# Patient Record
Sex: Male | Born: 1950 | Race: White | Hispanic: No | Marital: Married | Smoking: Former smoker
Health system: Southern US, Community
[De-identification: ages and names within clinical notes are randomized; demographics above are authoritative.]

## PROBLEM LIST (undated history)

## (undated) DIAGNOSIS — E119 Type 2 diabetes mellitus without complications: Secondary | ICD-10-CM

## (undated) DIAGNOSIS — I639 Cerebral infarction, unspecified: Secondary | ICD-10-CM

## (undated) DIAGNOSIS — I10 Essential (primary) hypertension: Secondary | ICD-10-CM

## (undated) DIAGNOSIS — I4819 Other persistent atrial fibrillation: Secondary | ICD-10-CM

## (undated) HISTORY — PX: KNEE ARTHROSCOPY: SUR90

## (undated) HISTORY — DX: Cerebral infarction, unspecified: I63.9

---

## 2000-06-23 ENCOUNTER — Encounter: Admission: RE | Admit: 2000-06-23 | Discharge: 2000-09-21 | Payer: Self-pay | Admitting: Internal Medicine

## 2000-10-29 ENCOUNTER — Encounter: Admission: RE | Admit: 2000-10-29 | Discharge: 2001-01-27 | Payer: Self-pay | Admitting: Internal Medicine

## 2001-05-05 ENCOUNTER — Ambulatory Visit (HOSPITAL_COMMUNITY): Admission: RE | Admit: 2001-05-05 | Discharge: 2001-05-05 | Payer: Self-pay | Admitting: Gastroenterology

## 2005-04-18 ENCOUNTER — Encounter: Admission: RE | Admit: 2005-04-18 | Discharge: 2005-04-18 | Payer: Self-pay | Admitting: Internal Medicine

## 2006-03-31 ENCOUNTER — Encounter: Admission: RE | Admit: 2006-03-31 | Discharge: 2006-03-31 | Payer: Self-pay | Admitting: Specialist

## 2008-10-12 ENCOUNTER — Ambulatory Visit (HOSPITAL_BASED_OUTPATIENT_CLINIC_OR_DEPARTMENT_OTHER): Admission: RE | Admit: 2008-10-12 | Discharge: 2008-10-12 | Payer: Self-pay | Admitting: Orthopedic Surgery

## 2009-10-26 ENCOUNTER — Ambulatory Visit (HOSPITAL_COMMUNITY): Admission: RE | Admit: 2009-10-26 | Discharge: 2009-10-26 | Payer: Self-pay | Admitting: Gastroenterology

## 2011-05-07 NOTE — Op Note (Signed)
NAME:  Gerald Baker, Gerald Baker NO.:  [id number]   MEDICAL RECORD NO.:  [id number]          PATIENT TYPE:  AMB   LOCATION:  [facility]                         FACILITY:  [facility]   PHYSICIAN:  Ollen Gross, M.D.    DATE OF BIRTH:  1951/03/28   DATE OF PROCEDURE:  10/12/2008  DATE OF DISCHARGE:                               OPERATIVE REPORT   PREOPERATIVE DIAGNOSIS:  Right knee medial meniscal tear.   POSTOPERATIVE DIAGNOSES:  Right knee medial meniscal tear, plus lateral  meniscal tear, plus chondral defect medial.   PROCEDURE:  Right knee arthroscopy with medial and lateral meniscal  debridement and chondroplasty.   SURGEON:  Ollen Gross, M.D.   No assistant.   ANESTHESIA:  General.   ESTIMATED BLOOD LOSS:  Minimal.   DRAINS:  None.   COMPLICATIONS:  None.   CONDITION:  Stable to recovery.   BRIEF CLINICAL NOTE:  Gerald Baker is a 60 year old male with a long history  of right knee pain with worsening recent pain and mechanical symptoms.  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  Repeat MRI showed a recurrent medial meniscal tear.  He  presents now for arthroscopy and debridement.   PROCEDURE IN DETAIL:  After successful administration of general  anesthetic a tourniquet is placed high on the right thigh and his right  lower extremity is prepped and draped in the usual sterile fashion.  Standard superomedial and inferolateral incisions were made.  Inflow  cannula passed superomedial and camera passed inferolateral.  Arthroscopic visualization proceeds.  Undersurface of the patella and  trochlea shows some grade II change, but no full-thickness defects.  The  medial and lateral gutters were visualized.  There were no loose bodies  present.  Flexion and valgus force applied to the knee and medial  compartment was entered.  The medial side does show a 2 x 2 cm chondral  defect on the medial femoral condyle.  In addition, there is a  tear in  the body and posterior horn of the medial meniscus.  The spinal needle  is used to localize the inferomedial portal.  A small incision made and  dilator placed.  Meniscus debrided back to stable base with baskets and  4.2 mm shaver and sealed off with the ArthroCare device.  The chondral  defect on the medial femoral condyle was debrided back to stable base  which was bony in the central aspect of the defect and then  cartilaginous base on the edges.  I abraded the bone for hopeful  formation of fibrocartilage.  There were no other lesions noted in the  medial compartment.  Intercondylar notch visualized.  The ACL has some  bulbous degeneration, but is intact.  Lateral compartment entered.  There is tear in the lateral meniscus.  It was debrided back to a stable  base with the basket shaver and sealed off with the ArthroCare.  The  patellofemoral joint is again inspected.  There were no unstable  fragments noted.  The arthroscopic equipment was then removed from the  inferior portals  which were closed with interrupted 4-0 nylon.  The 20  mL of 0.25% Marcaine with epi injected through the inflow cannula then  that is removed and that portal closed with nylon.  Bulky sterile  dressing is then applied and he is awakened and transported to recovery  in stable condition.      Ollen Gross, M.D.  Electronically Signed     FA/MEDQ  D:  10/12/2008  T:  10/12/2008  Job:  [id number]

## 2011-09-24 LAB — URINALYSIS, ROUTINE W REFLEX MICROSCOPIC
Bilirubin Urine: NEGATIVE
Glucose, UA: NEGATIVE
Ketones, ur: NEGATIVE
Nitrite: NEGATIVE
Urobilinogen, UA: 0.2
pH: 6.5

## 2011-09-24 LAB — CBC
HCT: 46.3
RDW: 13.8
WBC: 10.1

## 2011-09-24 LAB — PROTIME-INR: Prothrombin Time: 12.9

## 2011-09-24 LAB — BASIC METABOLIC PANEL
Calcium: 9.4
GFR calc Af Amer: >60
GFR calc non Af Amer: >60
Glucose, Bld: 120 — ABNORMAL HIGH
Potassium: 4.2

## 2011-09-24 LAB — APTT: aPTT: 32

## 2014-01-20 ENCOUNTER — Encounter: Payer: Self-pay | Admitting: *Deleted

## 2014-01-20 ENCOUNTER — Telehealth: Payer: Self-pay | Admitting: Interventional Cardiology

## 2014-01-20 DIAGNOSIS — I4819 Other persistent atrial fibrillation: Secondary | ICD-10-CM

## 2014-01-20 DIAGNOSIS — M1711 Unilateral primary osteoarthritis, right knee: Secondary | ICD-10-CM

## 2014-01-20 DIAGNOSIS — E669 Obesity, unspecified: Secondary | ICD-10-CM

## 2014-01-20 DIAGNOSIS — E785 Hyperlipidemia, unspecified: Secondary | ICD-10-CM

## 2014-01-20 DIAGNOSIS — E119 Type 2 diabetes mellitus without complications: Secondary | ICD-10-CM | POA: Insufficient documentation

## 2014-01-20 DIAGNOSIS — K5790 Diverticulosis of intestine, part unspecified, without perforation or abscess without bleeding: Secondary | ICD-10-CM

## 2014-01-20 DIAGNOSIS — I8390 Asymptomatic varicose veins of unspecified lower extremity: Secondary | ICD-10-CM | POA: Insufficient documentation

## 2014-01-20 DIAGNOSIS — R195 Other fecal abnormalities: Secondary | ICD-10-CM

## 2014-01-20 DIAGNOSIS — I480 Paroxysmal atrial fibrillation: Secondary | ICD-10-CM | POA: Insufficient documentation

## 2014-01-20 DIAGNOSIS — I10 Essential (primary) hypertension: Secondary | ICD-10-CM | POA: Insufficient documentation

## 2014-01-20 NOTE — Telephone Encounter (Signed)
returned pt call.pt sts that yesterday he over did it with yard work. pt sts that  his bp was 98/70 his hear rate has been between 70-80.pt denies sob, chest pain, palpitations. pt sts that he feels better today bp 118/60.pt would like an earlier appt made with Tereso NewcomerScott Weaver, PA. Pt aware of appt and verbalized understanding.

## 2014-01-20 NOTE — Telephone Encounter (Signed)
New Message  Pt called states that his BP is extremely low and he believes that he is currently in AFIB// Requesting a soon appt with Dr. Smith// Declined PA or NP// Please assist.

## 2014-01-24 ENCOUNTER — Encounter: Payer: Self-pay | Admitting: Physician Assistant

## 2014-01-24 ENCOUNTER — Ambulatory Visit (INDEPENDENT_AMBULATORY_CARE_PROVIDER_SITE_OTHER): Payer: BC Managed Care – PPO | Admitting: Physician Assistant

## 2014-01-24 VITALS — BP 148/88 | HR 102 | Ht 72.0 in | Wt 289.0 lb

## 2014-01-24 DIAGNOSIS — I4891 Unspecified atrial fibrillation: Secondary | ICD-10-CM

## 2014-01-24 DIAGNOSIS — I10 Essential (primary) hypertension: Secondary | ICD-10-CM

## 2014-01-24 MED ORDER — RIVAROXABAN 20 MG PO TABS: 20.0000 mg | ORAL_TABLET | Freq: Every day | ORAL | Status: DC

## 2014-01-24 MED ORDER — METOPROLOL TARTRATE 100 MG PO TABS: 150.0000 mg | ORAL_TABLET | Freq: Two times a day (BID) | ORAL | Status: DC

## 2014-01-24 NOTE — Patient Instructions (Addendum)
INCREASE METOPROLOL TO 150 MG TWICE DAILY; REFILL SENT IN  START XARELTO 20 MG DAILY  PLEASE FOLLOW UP WITH SCOTT WEAVER, PAC 3 WEEKS SAME DAY DR. Katrinka BlazingSMITH IS IN THE OFFICE  STOP ASPIRIN  FISH OIL OK TO TAKE

## 2014-01-24 NOTE — Progress Notes (Addendum)
[address] [place], [place]  [address] Phone: [phone]([phone] Fax:  [phone]  Date:  01/24/2014   ID:  Aris LotHarold P Stoutenburg, DOB 09-16-1951, MRN [id number]  PCP:  Lillia MountainGRIFFIN,JOHN JOSEPH, MD  Cardiologist:  Dr. Verdis PrimeHenry Smith     History of Present Illness: Gerald BaconHarold P Kiger is a 63 y.o. male with a hx of AFib, T2DM, HTN, HL.  Echo (10/2011):  EF 45-50%, mild LVH, trace MR, mild TR, mild LAE.  Event monitor (11/2011):  NSR, no AFib.  Last seen by Dr. Verdis PrimeHenry Smith 12/10/2011.  Xarelto stopped at that time.    He was out in his yard doing heavy work last week.  He was exhausted and felt fatigued the next day.  He felt like he did when he was out of rhythm in 2012.  His BP was in the 90s.  He felt lightheaded.  Over the next couple of days, he felt better and feels back to normal today.  He thought he was probably back in NSR today.  He denies chest pain, dyspnea, syncope.  No orthopnea, PND, edema.    Recent Labs: No results found for requested labs within last 365 days. 01/07/2014:  K 5.1, creatinine 0.76, ALT 22 (from PCP office)  Wt Readings from Last 3 Encounters:  01/24/14 289 lb (131.09 kg)     No past medical history on file.  Current Outpatient Prescriptions  Medication Sig Dispense Refill  . aspirin 81 MG tablet Take 81 mg by mouth daily.      . metFORMIN (GLUCOPHAGE) 500 MG tablet Take 500 mg by mouth 2 (two) times daily.      . metoprolol (LOPRESSOR) 100 MG tablet Take 100 mg by mouth 2 (two) times daily.      . naproxen (NAPROSYN) 500 MG tablet Take 500 mg by mouth as needed.       . Omega-3 Fatty Acids (FISH OIL) 1200 MG CAPS Take by mouth.       No current facility-administered medications for this visit.    Allergies:   Altace; Cozaar; Penicillins; and Sulfur   Social History:  The patient  reports that he has quit smoking. He does not have any smokeless tobacco history on file.   Family History:  The patient's family history is not on file.   ROS:  Please see the  history of present illness.      All other systems reviewed and negative.   PHYSICAL EXAM: VS:  BP 148/88  Pulse 102  Ht 6' (1.829 m)  Wt 289 lb (131.09 kg)  BMI 39.19 kg/m2 Well nourished, well developed, in no acute distress HEENT: normal Neck: no JVD Cardiac:  normal S1, S2; irreg irreg rhythm; no murmur Lungs:  clear to auscultation bilaterally, no wheezing, rhonchi or rales Abd: soft, nontender, no hepatomegaly Ext: no edema Skin: warm and dry Neuro:  CNs 2-12 intact, no focal abnormalities noted  EKG:  Atrial fibrillation, HR 102     ASSESSMENT AND PLAN:  1. Artrial Fibrillation with RVR:  He is back in AFib with RVR.  CHADS2-VASc=2.  He would benefit from long term anticoagulation.  Stop ASA.  Start Xarelto 20 QD.  Increase Metoprolol to 150 mg BID.  Follow up in 3 weeks. If still in AFib at that time, consider DCCV.  Question if he should be started on an antiarrhythmic to ensure he maintains NSR.  Will review with Dr. Verdis PrimeHenry Smith at time of follow up. 2. Hypertension:  BP above target.  Adjust beta blocker as noted.  3. Disposition:  F/u with Dr. Verdis Prime or me in 3 weeks.    Signed, Tereso Newcomer, PA-C  01/24/2014 11:15 AM

## 2014-02-10 ENCOUNTER — Ambulatory Visit (INDEPENDENT_AMBULATORY_CARE_PROVIDER_SITE_OTHER): Payer: BC Managed Care – PPO | Admitting: Physician Assistant

## 2014-02-10 ENCOUNTER — Encounter: Payer: Self-pay | Admitting: Physician Assistant

## 2014-02-10 VITALS — BP 140/82 | HR 58 | Ht 72.0 in | Wt 285.0 lb

## 2014-02-10 DIAGNOSIS — I10 Essential (primary) hypertension: Secondary | ICD-10-CM

## 2014-02-10 DIAGNOSIS — I4819 Other persistent atrial fibrillation: Secondary | ICD-10-CM

## 2014-02-10 DIAGNOSIS — I4891 Unspecified atrial fibrillation: Secondary | ICD-10-CM

## 2014-02-10 NOTE — Progress Notes (Signed)
[address] [place], [place]  [address] Phone: [phone] Fax:  [phone]  Date:  02/10/2014   ID:  Gerald, Baker 10-05-1951, MRN [id number]  PCP:  Lillia Mountain, MD  Cardiologist:  Dr. Verdis Prime     History of Present Illness: Gerald Baker is a 63 y.o. male with a hx of AFib, T2DM, HTN, HL.  Echo (10/2011):  EF 45-50%, mild LVH, trace MR, mild TR, mild LAE.  Event monitor (11/2011):  NSR, no AFib.  Last seen by Dr. Verdis Prime 12/10/2011.  Xarelto stopped at that time.    I saw him in 01/24/14. He was back in atrial fibrillation with RVR. I placed him back on Xarelto for anticoagulation and increased his metoprolol. He was brought back for follow up today with an eye towards cardioversion if he remains in atrial fibrillation.  He is back in NSR today.  He feels like he was probably out of rhythm for about 10 days. He feels back to normal.  Energy level is normal.  No chest pain, dyspnea, syncope, orthopnea, PND, edema.    Recent Labs: No results found for requested labs within last 365 days. 01/07/2014:  K 5.1, creatinine 0.76, ALT 22 (from PCP office)  Wt Readings from Last 3 Encounters:  02/10/14 285 lb (129.275 kg)  01/24/14 289 lb (131.09 kg)     No past medical history on file.  Current Outpatient Prescriptions  Medication Sig Dispense Refill  . metFORMIN (GLUCOPHAGE) 500 MG tablet Take 500 mg by mouth 2 (two) times daily.      . metoprolol (LOPRESSOR) 100 MG tablet Take 1.5 tablets (150 mg total) by mouth 2 (two) times daily.  90 tablet  11  . naproxen (NAPROSYN) 500 MG tablet Take 500 mg by mouth as needed.       . Omega-3 Fatty Acids (FISH OIL) 1200 MG CAPS Take by mouth.      . Rivaroxaban (XARELTO) 20 MG TABS tablet Take 1 tablet (20 mg total) by mouth daily with supper.  30 tablet  11   No current facility-administered medications for this visit.    Allergies:   Altace; Cozaar; Penicillins; and Sulfur   Social History:  The patient   reports that he has quit smoking. He does not have any smokeless tobacco history on file.   Family History:  The patient's family history is not on file.   ROS:  Please see the history of present illness.  No hx of snoring.  All other systems reviewed and negative.   PHYSICAL EXAM: VS:  BP 140/82  Pulse 58  Ht 6' (1.829 m)  Wt 285 lb (129.275 kg)  BMI 38.64 kg/m2 Well nourished, well developed, in no acute distress HEENT: normal Cardiac:  normal S1, S2; RRR; no murmur Lungs:  clear to auscultation bilaterally, no wheezing, rhonchi or rales Abd: soft, non-tender  Ext: no edema Skin: warm and dry Neuro:  CNs 2-12 intact, no focal abnormalities noted  EKG:   Sinus brady, HR 58, normal axis, no ST changes     ASSESSMENT AND PLAN:  1. Paroxysmal Atrial Fibrillation:  He is back in NSR.  CHADS2-VASc=2 (HTN, DM).  He will remain on Xarelto.  We had a long discussion today regarding his increased risk of stroke with AFib and the risk reduction with anticoagulation Rx.  He is hesitant but agreeable to remain on Xarelto.  Check CBC and BMET in 1 month.   2. Hypertension:  Better controlled.  Continue current Rx. 3. Disposition:  F/u with f/u with Dr. Verdis PrimeHenry Smith in 3 mos.    Signed, Tereso NewcomerScott Kieffer Blatz, PA-C  02/10/2014 9:34 AM

## 2014-02-10 NOTE — Patient Instructions (Addendum)
LAB WORK 03/18/14 @ 10:30  PLEASE FOLLOW UP WITH DR. Katrinka BlazingSMITH 05/10/14 @ 10:30  Your physician recommends that you continue on your current medications as directed. Please refer to the Current Medication list given to you today.

## 2014-02-14 ENCOUNTER — Ambulatory Visit: Payer: BC Managed Care – PPO | Admitting: Physician Assistant

## 2014-03-18 ENCOUNTER — Other Ambulatory Visit (INDEPENDENT_AMBULATORY_CARE_PROVIDER_SITE_OTHER): Payer: BC Managed Care – PPO

## 2014-03-18 DIAGNOSIS — I4891 Unspecified atrial fibrillation: Secondary | ICD-10-CM

## 2014-03-18 DIAGNOSIS — I10 Essential (primary) hypertension: Secondary | ICD-10-CM

## 2014-03-18 DIAGNOSIS — I4819 Other persistent atrial fibrillation: Secondary | ICD-10-CM

## 2014-03-18 LAB — CBC WITH DIFFERENTIAL/PLATELET
BASOS ABS: 0 10*3/uL (ref 0.0–0.1)
Basophils Relative: 0.5 % (ref 0.0–3.0)
EOS PCT: 3.2 % (ref 0.0–5.0)
Eosinophils Absolute: 0.2 10*3/uL (ref 0.0–0.7)
HEMATOCRIT: 43.1 % (ref 39.0–52.0)
HEMOGLOBIN: 14.4 g/dL (ref 13.0–17.0)
LYMPHS PCT: 34.4 % (ref 12.0–46.0)
Lymphs Abs: 2.6 10*3/uL (ref 0.7–4.0)
MCHC: 33.4 g/dL (ref 30.0–36.0)
MCV: 89.1 fl (ref 78.0–100.0)
MONO ABS: 0.6 10*3/uL (ref 0.1–1.0)
MONOS PCT: 7.9 % (ref 3.0–12.0)
NEUTROS ABS: 4.1 10*3/uL (ref 1.4–7.7)
Neutrophils Relative %: 54 % (ref 43.0–77.0)
PLATELETS: 237 10*3/uL (ref 150.0–400.0)
RBC: 4.84 Mil/uL (ref 4.22–5.81)
RDW: 14.5 % (ref 11.5–14.6)
WBC: 7.6 10*3/uL (ref 4.5–10.5)

## 2014-03-18 LAB — BASIC METABOLIC PANEL
BUN: 15 mg/dL (ref 6–23)
CALCIUM: 9.1 mg/dL (ref 8.4–10.5)
CO2: 29 meq/L (ref 19–32)
CREATININE: 0.8 mg/dL (ref 0.4–1.5)
Chloride: 100 mEq/L (ref 96–112)
GFR: 112.71 mL/min (ref >60.00)
Glucose, Bld: 112 mg/dL — ABNORMAL HIGH (ref 70–99)
POTASSIUM: 4.3 meq/L (ref 3.5–5.1)
Sodium: 137 mEq/L (ref 135–145)

## 2014-05-10 ENCOUNTER — Encounter: Payer: Self-pay | Admitting: Interventional Cardiology

## 2014-05-10 ENCOUNTER — Ambulatory Visit (INDEPENDENT_AMBULATORY_CARE_PROVIDER_SITE_OTHER): Payer: BC Managed Care – PPO | Admitting: Interventional Cardiology

## 2014-05-10 VITALS — BP 140/82 | HR 60 | Ht 72.0 in | Wt 279.0 lb

## 2014-05-10 DIAGNOSIS — R0683 Snoring: Secondary | ICD-10-CM

## 2014-05-10 DIAGNOSIS — I480 Paroxysmal atrial fibrillation: Secondary | ICD-10-CM

## 2014-05-10 DIAGNOSIS — I5032 Chronic diastolic (congestive) heart failure: Secondary | ICD-10-CM | POA: Insufficient documentation

## 2014-05-10 DIAGNOSIS — I10 Essential (primary) hypertension: Secondary | ICD-10-CM

## 2014-05-10 DIAGNOSIS — Z7901 Long term (current) use of anticoagulants: Secondary | ICD-10-CM | POA: Insufficient documentation

## 2014-05-10 DIAGNOSIS — E785 Hyperlipidemia, unspecified: Secondary | ICD-10-CM

## 2014-05-10 DIAGNOSIS — R0989 Other specified symptoms and signs involving the circulatory and respiratory systems: Secondary | ICD-10-CM

## 2014-05-10 DIAGNOSIS — I4891 Unspecified atrial fibrillation: Secondary | ICD-10-CM

## 2014-05-10 DIAGNOSIS — R0609 Other forms of dyspnea: Secondary | ICD-10-CM

## 2014-05-10 NOTE — Progress Notes (Signed)
Patient ID: Gerald Baker Hennick, male   DOB: 1951/06/07, 63 y.o.   MRN: [id number]    [address] [place], [place]  [address] Phone: [phone]([phone] Fax:  [phone]) [phone]  Date:  05/10/2014   ID:  Gerald Baker Myles, DOB 1951/06/07, MRN [id number]  PCP:  Lillia MountainGRIFFIN,JOHN JOSEPH, MD   ASSESSMENT:  1. Paroxysmal atrial fibrillation, now back in normal sinus rhythm. Prior episode required electrical cardioversion. 2. Chronic anticoagulation therapy 3. Snoring 4. Hypertension 5. Obesity  PLAN:  1. Continue chronic anticoagulation with Xarelto 2. Physical activity as tolerated 3. No attempt at rhythm control at this time since he is only had 2 episodes of atrial fibrillation over the past 4 years.   SUBJECTIVE: Gerald Baker Hudgins is a 63 y.o. male now feels back to normal. Fatigue associated with atrial fibrillation has resolved. He was unable tolerate the 150 mg twice a day of metoprolol. He does snore. They doubt he has sleep apnea. He has not had bleeding on anticoagulation. No neurological complaints. He denies orthopnea and dyspnea on exertion.   Wt Readings from Last 3 Encounters:  05/10/14 279 lb (126.554 kg)  02/10/14 285 lb (129.275 kg)  01/24/14 289 lb (131.09 kg)     No past medical history on file.  Current Outpatient Prescriptions  Medication Sig Dispense Refill  . metFORMIN (GLUCOPHAGE) 500 MG tablet Take 500 mg by mouth 2 (two) times daily.      . metoprolol (LOPRESSOR) 100 MG tablet Take 1.5 tablets (150 mg total) by mouth 2 (two) times daily.  90 tablet  11  . naproxen (NAPROSYN) 500 MG tablet Take 500 mg by mouth as needed.       . Rivaroxaban (XARELTO) 20 MG TABS tablet Take 1 tablet (20 mg total) by mouth daily with supper.  30 tablet  11   No current facility-administered medications for this visit.    Allergies:    Allergies  Allergen Reactions  . Altace [Ramipril]     cough  . Cozaar [Losartan Potassium]     Leg pain  . Penicillins   . Sulfur     rash      Social History:  The patient  reports that he has quit smoking. He does not have any smokeless tobacco history on file.   ROS:  Please see the history of present illness.   No syncope. Denies angina.   All other systems reviewed and negative.   OBJECTIVE: VS:  BP 140/82  Pulse 60  Ht 6' (1.829 m)  Wt 279 lb (126.554 kg)  BMI 37.83 kg/m2 Well nourished, well developed, in no acute distress, obese, abnormally tanned HEENT: normal Neck: JVD flat. Carotid bruit absent  Cardiac:  normal S1, S2; RRR; no murmur Lungs:  clear to auscultation bilaterally, no wheezing, rhonchi or rales Abd: soft, nontender, no hepatomegaly Ext: Edema absent but with bilateral varicosities. Pulses 2+ and symmetric Skin: warm and dry Neuro:  CNs 2-12 intact, no focal abnormalities noted  EKG:  Not repeated. Was in normal sinus rhythm on last visit 2 months ago       Signed, Darci NeedleHenry W. B. Haston Casebolt III, MD 05/10/2014 11:35 AM

## 2014-05-10 NOTE — Patient Instructions (Signed)
Your physician recommends that you continue on your current medications as directed. Please refer to the Current Medication list given to you today.  Your physician has recommended that you have a sleep study. This test records several body functions during sleep, including: brain activity, eye movement, oxygen and carbon dioxide blood levels, heart rate and rhythm, breathing rate and rhythm, the flow of air through your mouth and nose, snoring, body muscle movements, and chest and belly movement.  Your physician wants you to follow-up in: 6 months You will receive a reminder letter in the mail two months in advance. If you don't receive a letter, please call our office to schedule the follow-up appointment.

## 2014-09-15 ENCOUNTER — Telehealth: Payer: Self-pay | Admitting: Interventional Cardiology

## 2014-09-15 NOTE — Telephone Encounter (Signed)
Dr. Katrinka Blazing aware of pt falling and recommends for pt to hold the Xarelto dose tonight. Pt is aware he verbalized understanding.

## 2014-09-15 NOTE — Telephone Encounter (Signed)
Pt called because this morning he fell and got a small cut on his elbow, had a small amount of bleeding. He applied a bandage; the bleeding did stop. At the same time he had a knot that  came up on his wrist.  The knot is about an inch and half wide  up swollen it has not gotten bigger since it happened . Pt is taken xarelto. Pt would like to know if it is okay for him to take  to take the Xarelto dose this evening.

## 2014-09-15 NOTE — Telephone Encounter (Signed)
New message    Patient fell cut on elbow.- bleeding has stop.   Knot came up wrist . On xarelto.   No dizziness, no sob, no chest pain.

## 2014-11-24 NOTE — Progress Notes (Signed)
Patient ID: Gerald Baker Tessler, male   DOB: September 10, 1951, 63 y.o.   MRN: [id number]    [address] [place], [place]  [address] Phone: [phone]([phone] Fax:  [phone]  Date:  11/25/2014   ID:  Gerald Baker Roesler, DOB September 10, 1951, MRN [id number]  PCP:  Lillia MountainGRIFFIN,JOHN JOSEPH, MD   ASSESSMENT:  1. Chronic diastolic heart failure, stable as long as he is not in atrial fibrillation 2. Paroxysmal atrial fibrillation, clinically silent 3. Chronic anticoagulation therapy with Xarelto 4. Obesity and snoring raising concern for sleep apnea   PLAN:  1. No cyanosis of any falls or head trauma 2. Will begin giving Xarelto on a 3 month supply schedule 3. Clinical follow-up in one year   SUBJECTIVE: Gerald Baker Pfeffer is a 63 y.o. male doing well. He did have a fall earlier this year weight to normal dominant embankment while plugging his grass with the machine. In the upper for large bruise on his left thigh and a large hematoma in the wrist of the left. He did not hit his head. He has not had chest pain, orthopnea, or PND. He has had an episode or 2 of atrial fibrillation each lasts up to 5 days. Generally though he has done quite well without clinical problems.   Wt Readings from Last 3 Encounters:  11/25/14 285 lb 6.4 oz (129.457 kg)  05/10/14 279 lb (126.554 kg)  02/10/14 285 lb (129.275 kg)     No past medical history on file.  Current Outpatient Prescriptions  Medication Sig Dispense Refill  . metFORMIN (GLUCOPHAGE) 500 MG tablet Take 500 mg by mouth 2 (two) times daily.    . metoprolol (LOPRESSOR) 100 MG tablet Take 1.5 tablets (150 mg total) by mouth 2 (two) times daily. (Patient taking differently: Take 100 mg by mouth 2 (two) times daily. ) 90 tablet 11  . naproxen (NAPROSYN) 500 MG tablet Take 500 mg by mouth as needed.     . Rivaroxaban (XARELTO) 20 MG TABS tablet Take 1 tablet (20 mg total) by mouth daily with supper. 30 tablet 11   No current facility-administered medications  for this visit.    Allergies:    Allergies  Allergen Reactions  . Altace [Ramipril]     cough  . Cozaar [Losartan Potassium]     Leg pain  . Penicillins   . Sulfur     rash    Social History:  The patient  reports that he has quit smoking. He does not have any smokeless tobacco history on file.   ROS:  Please see the history of present illness.   No blood in urine or stool. He did have a fall down an embankment while plugging his grass and got a large hematoma on his left wrist and had a big bruise on the left side.   All other systems reviewed and negative.   OBJECTIVE: VS:  BP 148/88 mmHg  Pulse 73  Ht 6' (1.829 m)  Wt 285 lb 6.4 oz (129.457 kg)  BMI 38.70 kg/m2  SpO2 99% Well nourished, well developed, in no acute distress, obese HEENT: normal Neck: JVD flat. Carotid bruit absent  Cardiac:  normal S1, S2; RRR; no murmur Lungs:  clear to auscultation bilaterally, no wheezing, rhonchi or rales Abd: soft, nontender, no hepatomegaly Ext: Edema absent. Pulses 2+ Skin: warm and dry Neuro:  CNs 2-12 intact, no focal abnormalities noted  EKG:  None performed today       Signed, Sherilyn CooterHenry  Lacie DraftW. B. Smith III, MD 11/25/2014 2:58 PM

## 2014-11-25 ENCOUNTER — Ambulatory Visit (INDEPENDENT_AMBULATORY_CARE_PROVIDER_SITE_OTHER): Payer: BC Managed Care – PPO | Admitting: Interventional Cardiology

## 2014-11-25 ENCOUNTER — Encounter: Payer: Self-pay | Admitting: Interventional Cardiology

## 2014-11-25 VITALS — BP 148/88 | HR 73 | Ht 72.0 in | Wt 285.4 lb

## 2014-11-25 DIAGNOSIS — I10 Essential (primary) hypertension: Secondary | ICD-10-CM

## 2014-11-25 DIAGNOSIS — I480 Paroxysmal atrial fibrillation: Secondary | ICD-10-CM

## 2014-11-25 DIAGNOSIS — E785 Hyperlipidemia, unspecified: Secondary | ICD-10-CM

## 2014-11-25 DIAGNOSIS — I5032 Chronic diastolic (congestive) heart failure: Secondary | ICD-10-CM

## 2014-11-25 DIAGNOSIS — Z7901 Long term (current) use of anticoagulants: Secondary | ICD-10-CM

## 2014-11-25 MED ORDER — METOPROLOL TARTRATE 100 MG PO TABS: 100.0000 mg | ORAL_TABLET | Freq: Two times a day (BID) | ORAL | Status: AC

## 2014-11-25 NOTE — Patient Instructions (Signed)
Your physician recommends that you continue on your current medications as directed. Please refer to the Current Medication list given to you today.  Your physician wants you to follow-up in: 1 year with Dr.Smith You will receive a reminder letter in the mail two months in advance. If you don't receive a letter, please call our office to schedule the follow-up appointment.  

## 2014-12-23 ENCOUNTER — Encounter: Payer: Self-pay | Admitting: Interventional Cardiology

## 2014-12-27 ENCOUNTER — Other Ambulatory Visit: Payer: Self-pay | Admitting: *Deleted

## 2014-12-27 ENCOUNTER — Other Ambulatory Visit: Payer: Self-pay

## 2014-12-27 MED ORDER — RIVAROXABAN 20 MG PO TABS: 20.0000 mg | ORAL_TABLET | Freq: Every day | ORAL | Status: DC

## 2015-01-04 ENCOUNTER — Other Ambulatory Visit: Payer: Self-pay

## 2015-01-04 MED ORDER — RIVAROXABAN 20 MG PO TABS: 20.0000 mg | ORAL_TABLET | Freq: Every day | ORAL | Status: DC

## 2015-01-04 NOTE — Telephone Encounter (Signed)
Refill rqst received from my chart for Xarelto to be sent to [facility]. Done

## 2015-06-07 ENCOUNTER — Other Ambulatory Visit: Payer: Self-pay

## 2015-06-07 MED ORDER — RIVAROXABAN 20 MG PO TABS: 20.0000 mg | ORAL_TABLET | Freq: Every day | ORAL | Status: DC

## 2015-10-16 ENCOUNTER — Telehealth: Payer: Self-pay | Admitting: Interventional Cardiology

## 2015-10-16 NOTE — Telephone Encounter (Signed)
New message  Patient c/o Afib:  High priority if patient c/o lightheadedness and shortness of breath.  1. How long have you been having Afib? For about 10 days. Pt states that he is on Xarelto but it hasn't ever lasted this long   2. Are you currently experiencing lightheadedness and shortness of breath? SOB yes. When he walks he finds that he gets SOB and that's not normal for him   3. Have you checked your BP and heart rate? (document readings) Yes. Pt reports that he keeps check on it and of course the BP monitor shows irregular heart beat after he takes Metoprolol.   9:57a 109/85 Heart rate was 72 This was about 30 minutes after the metoprolol 9:18a 120/101 heart rate was 68 This was before the medication   4. Are you experiencing any other symptoms? Not really other than SOB and kind of tired.

## 2015-10-18 ENCOUNTER — Ambulatory Visit (HOSPITAL_COMMUNITY)
Admission: RE | Admit: 2015-10-18 | Discharge: 2015-10-18 | Disposition: A | Discharge status: Home / Self Care | Payer: BC Managed Care – PPO | Source: Ambulatory Visit | Attending: Nurse Practitioner | Admitting: Nurse Practitioner

## 2015-10-18 ENCOUNTER — Other Ambulatory Visit: Payer: Self-pay | Admitting: *Deleted

## 2015-10-18 VITALS — BP 124/86 | HR 116 | Ht 72.0 in | Wt 260.4 lb

## 2015-10-18 DIAGNOSIS — I480 Paroxysmal atrial fibrillation: Secondary | ICD-10-CM | POA: Diagnosis not present

## 2015-10-18 DIAGNOSIS — I481 Persistent atrial fibrillation: Secondary | ICD-10-CM | POA: Insufficient documentation

## 2015-10-18 LAB — CBC
HEMATOCRIT: 45.9 % (ref 39.0–52.0)
Hemoglobin: 15.4 g/dL (ref 13.0–17.0)
MCH: 30.5 pg (ref 26.0–34.0)
MCHC: 33.6 g/dL (ref 30.0–36.0)
MCV: 90.9 fL (ref 78.0–100.0)
PLATELETS: 266 10*3/uL (ref 150–400)
RBC: 5.05 MIL/uL (ref 4.22–5.81)
RDW: 13.5 % (ref 11.5–15.5)
WBC: 8.7 10*3/uL (ref 4.0–10.5)

## 2015-10-18 LAB — BASIC METABOLIC PANEL
Anion gap: 11 (ref 5–15)
BUN: 12 mg/dL (ref 6–20)
CALCIUM: 9.3 mg/dL (ref 8.9–10.3)
CO2: 28 mmol/L (ref 22–32)
CREATININE: 0.83 mg/dL (ref 0.61–1.24)
Chloride: 102 mmol/L (ref 101–111)
GFR calc non Af Amer: >60 mL/min (ref >60)
GLUCOSE: 98 mg/dL (ref 65–99)
Potassium: 4.5 mmol/L (ref 3.5–5.1)
Sodium: 141 mmol/L (ref 135–145)

## 2015-10-18 LAB — TSH: TSH: 1.683 u[IU]/mL (ref 0.350–4.500)

## 2015-10-18 NOTE — Progress Notes (Signed)
Patient ID: Gerald Baker, male   DOB: [birth year], 64 y.o.   MRN: [id number]     Primary Care Physician: Lillia MountainGRIFFIN,JOHN JOSEPH, MD Referring Physician: [facility]   Gerald Baker is a 64 y.o. male with a h/o PAF, with 3 episodes since 2014. Last episode was in 2015 when he had afib x 10 days. He spontaneously converted to SR. He developed afi this time after a day of exertion and having 3 alcoholic drinks that pm. He has been in afib now x 5-6 days. He has not missed any doses of xarelto. He is on daily doses of metoprolol 100 mg bid and will take an extra 1/2 tab of metoprolol as needed. He does notice increased fatigue, dyspnea when he is in afib. Discussed antiarrhythmics vrs cardioversion and pt does not feel his burden is significant enough to warrant antiarrythmic at this point. He does not snore, is active, is on nutrasystem and is losing weight. No significant caffeine use, but does drink 1-3 alcoholic drinks a night.  Today, he denies symptoms of palpitations, chest pain, shortness of breath, orthopnea, PND, lower extremity edema, dizziness, presyncope, syncope, or neurologic sequela. The patient is tolerating medications without difficulties and is otherwise without complaint today.   No past medical history on file. No past surgical history on file.  Current Outpatient Prescriptions  Medication Sig Dispense Refill  . metFORMIN (GLUCOPHAGE) 500 MG tablet Take 500 mg by mouth daily.     . metoprolol (LOPRESSOR) 100 MG tablet Take 1 tablet (100 mg total) by mouth 2 (two) times daily.    . rivaroxaban (XARELTO) 20 MG TABS tablet Take 1 tablet (20 mg total) by mouth daily with supper. 90 tablet 1   No current facility-administered medications for this encounter.    Allergies  Allergen Reactions  . Altace [Ramipril]     cough  . Cozaar [Losartan Potassium]     Leg pain  . Penicillins   . Sulfur     rash    Social History   Social History  . Marital Status: Married    Spouse Name: N/A  . Number of Children: N/A  . Years of Education: N/A   Occupational History  . Not on file.   Social History Main Topics  . Smoking status: Former Games developermoker  . Smokeless tobacco: Not on file     Comment: Quit 1997  . Alcohol Use: Not on file  . Drug Use: Not on file  . Sexual Activity: Not on file   Other Topics Concern  . Not on file   Social History Narrative    Family History  Problem Relation Age of Onset  . Heart disease Father   . Heart failure Father     ROS- All systems are reviewed and negative except as per the HPI above  Physical Exam: Filed Vitals:   10/18/15 1423  BP: 124/86  Pulse: 116  Height: 6' (1.829 m)  Weight: 260 lb 6.4 oz (118.117 kg)    GEN- The patient is well appearing, alert and oriented x 3 today.   Head- normocephalic, atraumatic Eyes-  Sclera clear, conjunctiva pink Ears- hearing intact Oropharynx- clear Neck- supple, no JVP Lymph- no cervical lymphadenopathy Lungs- Clear to ausculation bilaterally, normal work of breathing Heart- Irregular rate and rhythm, no murmurs, rubs or gallops, PMI not laterally displaced GI- soft, NT, ND, + BS Extremities- no clubbing, cyanosis, or edema MS- no significant deformity or atrophy Skin- no rash or lesion Psych-  euthymic mood, full affect Neuro- strength and sensation are intact  EKG-Afib with rvr at 116 bpm, qrs int 72 ms, qtc 464 ms  Echo- 10/30/11- Mild left atrial enlargement, EF 45-50% with mild LVH, trace MR  Assessment and Plan: 1. Persistent afib Schedule for DCCV No missed doses of xarelto Continue metoprolol Discussed antiarrythmic's but pt not interested in changing treatment approach at this time  F/u in afib clinc in one one  [place] [facility] [address] [place], [place] [address] [phone]

## 2015-10-18 NOTE — Patient Instructions (Signed)
Cardioversion scheduled for Thursday, October 27th  - Arrive at the [facility] and go to admitting at [facility]11AM  -Do not eat or drink anything after midnight the night prior to your procedure.  - Take all your medication with a sip of water prior to arrival.  - You will not be able to drive home after your procedure.

## 2015-10-18 NOTE — Telephone Encounter (Signed)
Called patient to see he should be seen sooner at AFIB clinic. Spoke with patient.  He is still in afib. He is tired and gets SOB walking short distances. Spoke with Stacy at AFIB clinic, pt scheduled for today at 2pm. He was made aware of location.  I did not cancel his appointment with Nada BoozerLaura Ingold on 10/24/15, in case pt needs follow up. Informed pt to cancel if it is not needed to avoid no show fee.  Pt is appreciative of the call/appointment.

## 2015-10-19 ENCOUNTER — Ambulatory Visit (HOSPITAL_COMMUNITY): Payer: BLUE CROSS/BLUE SHIELD | Admitting: Anesthesiology

## 2015-10-19 ENCOUNTER — Encounter (HOSPITAL_COMMUNITY): Payer: Self-pay | Admitting: *Deleted

## 2015-10-19 ENCOUNTER — Encounter (HOSPITAL_COMMUNITY)
Admission: RE | Disposition: A | Discharge status: Home / Self Care | Payer: Self-pay | Source: Ambulatory Visit | Attending: Cardiovascular Disease

## 2015-10-19 ENCOUNTER — Ambulatory Visit (HOSPITAL_COMMUNITY)
Admission: RE | Admit: 2015-10-19 | Discharge: 2015-10-19 | Disposition: A | Discharge status: Home / Self Care | Payer: BLUE CROSS/BLUE SHIELD | Source: Ambulatory Visit | Attending: Cardiovascular Disease | Admitting: Cardiovascular Disease

## 2015-10-19 DIAGNOSIS — Z87891 Personal history of nicotine dependence: Secondary | ICD-10-CM | POA: Insufficient documentation

## 2015-10-19 DIAGNOSIS — I4891 Unspecified atrial fibrillation: Secondary | ICD-10-CM | POA: Diagnosis not present

## 2015-10-19 DIAGNOSIS — M1990 Unspecified osteoarthritis, unspecified site: Secondary | ICD-10-CM | POA: Insufficient documentation

## 2015-10-19 DIAGNOSIS — E119 Type 2 diabetes mellitus without complications: Secondary | ICD-10-CM | POA: Insufficient documentation

## 2015-10-19 DIAGNOSIS — I10 Essential (primary) hypertension: Secondary | ICD-10-CM | POA: Insufficient documentation

## 2015-10-19 DIAGNOSIS — I480 Paroxysmal atrial fibrillation: Secondary | ICD-10-CM

## 2015-10-19 HISTORY — DX: Type 2 diabetes mellitus without complications: E11.9

## 2015-10-19 HISTORY — PX: CARDIOVERSION: SHX1299

## 2015-10-19 LAB — GLUCOSE, CAPILLARY: Glucose-Capillary: 112 mg/dL — ABNORMAL HIGH (ref 65–99)

## 2015-10-19 SURGERY — CARDIOVERSION
Anesthesia: Monitor Anesthesia Care

## 2015-10-19 MED ORDER — LIDOCAINE HCL (CARDIAC) 20 MG/ML IV SOLN
INTRAVENOUS | Status: DC | PRN
  Administered 2015-10-19: 40 mg via INTRAVENOUS

## 2015-10-19 MED ORDER — PROPOFOL 10 MG/ML IV BOLUS
INTRAVENOUS | Status: DC | PRN
  Administered 2015-10-19: 90 mg via INTRAVENOUS

## 2015-10-19 NOTE — CV Procedure (Signed)
Electrical Cardioversion Procedure Note Ronny BaconHarold P Pressly [id number] May 09, 1951  Procedure: Electrical Cardioversion Indications:  Atrial Fibrillation  Procedure Details Consent: Risks of procedure as well as the alternatives and risks of each were explained to the (patient/caregiver).  Consent for procedure obtained. Time Out: Verified patient identification, verified procedure, site/side was marked, verified correct patient position, special equipment/implants available, medications/allergies/relevent history reviewed, required imaging and test results available.  Performed  Patient placed on cardiac monitor, pulse oximetry, supplemental oxygen as necessary.  Sedation given: Propafol Pacer pads placed anterior and posterior chest.  Cardioverted 1 time(s).  Cardioverted at 150J.  Evaluation Findings: Post procedure EKG shows: NSR Complications: None Patient did tolerate procedure well.   Madilyn Hookandolph, Crisanto Nied P, MD 10/19/2015, 12:07 PM

## 2015-10-19 NOTE — Anesthesia Procedure Notes (Signed)
Date/Time: 10/19/2015 12:03 PM Performed by: Glo HerringLEE, Margarett Viti B Pre-anesthesia Checklist: Patient identified, Emergency Drugs available, Suction available, Patient being monitored and Timeout performed Patient Re-evaluated:Patient Re-evaluated prior to inductionOxygen Delivery Method: Ambu bag Preoxygenation: Pre-oxygenation with 100% oxygen Intubation Type: IV induction Ventilation: Mask ventilation without difficulty Placement Confirmation: breath sounds checked- equal and bilateral Dental Injury: Teeth and Oropharynx as per pre-operative assessment

## 2015-10-19 NOTE — Anesthesia Preprocedure Evaluation (Signed)
Anesthesia Evaluation  Patient identified by MRN, date of birth, ID band  Reviewed: Allergy & Precautions, NPO status   Airway Mallampati: I  TM Distance: >3 FB     Dental  (+) Teeth Intact   Pulmonary former smoker,    breath sounds clear to auscultation       Cardiovascular hypertension, + Peripheral Vascular Disease  + dysrhythmias  Rhythm:Irregular Rate:Normal     Neuro/Psych    GI/Hepatic   Endo/Other  diabetesMorbid obesity  Renal/GU      Musculoskeletal  (+) Arthritis ,   Abdominal (+) + obese,   Peds  Hematology   Anesthesia Other Findings   Reproductive/Obstetrics                             Anesthesia Physical Anesthesia Plan  ASA: III  Anesthesia Plan: MAC   Post-op Pain Management:    Induction: Intravenous  Airway Management Planned: Mask  Additional Equipment:   Intra-op Plan:   Post-operative Plan:   Informed Consent: I have reviewed the patients History and Physical, chart, labs and discussed the procedure including the risks, benefits and alternatives for the proposed anesthesia with the patient or authorized representative who has indicated his/her understanding and acceptance.   Dental advisory given  Plan Discussed with: CRNA and Surgeon  Anesthesia Plan Comments:         Anesthesia Quick Evaluation

## 2015-10-19 NOTE — Discharge Instructions (Signed)
Electrical Cardioversion, Care After °Refer to this sheet in the next few weeks. These instructions provide you with information on caring for yourself after your procedure. Your health care provider may also give you more specific instructions. Your treatment has been planned according to current medical practices, but problems sometimes occur. Call your health care provider if you have any problems or questions after your procedure. °WHAT TO EXPECT AFTER THE PROCEDURE °After your procedure, it is typical to have the following sensations: °· Some redness on the skin where the shocks were delivered. If this is tender, a sunburn lotion or hydrocortisone cream may help. °· Possible return of an abnormal heart rhythm within hours or days after the procedure. °HOME CARE INSTRUCTIONS °· Take medicines only as directed by your health care provider. Be sure you understand how and when to take your medicine. °· Learn how to feel your pulse and check it often. °· Limit your activity for 48 hours after the procedure or as directed by your health care provider. °· Avoid or minimize caffeine and other stimulants as directed by your health care provider. °SEEK MEDICAL CARE IF: °· You feel like your heart is beating too fast or your pulse is not regular. °· You have any questions about your medicines. °· You have bleeding that will not stop. °SEEK IMMEDIATE MEDICAL CARE IF: °· You are dizzy or feel faint. °· It is hard to breathe or you feel short of breath. °· There is a change in discomfort in your chest. °· Your speech is slurred or you have trouble moving an arm or leg on one side of your body. °· You get a serious muscle cramp that does not go away. °· Your fingers or toes turn cold or blue. °  °This information is not intended to replace advice given to you by your health care provider. Make sure you discuss any questions you have with your health care provider. °  °Document Released: 09/29/2013 Document Revised: 12/30/2014  Document Reviewed: 09/29/2013 °Elsevier Interactive Patient Education ©2016 [employer] Inc[employer] ° °

## 2015-10-19 NOTE — Anesthesia Postprocedure Evaluation (Signed)
  Anesthesia Post-op Note  Patient: Gerald Baker  Procedure(s) Performed: Procedure(s): CARDIOVERSION (N/A)  Patient Location: PACU  Anesthesia Type:MAC  Level of Consciousness: awake and alert   Airway and Oxygen Therapy: Patient Spontanous Breathing  Post-op Pain: none  Post-op Assessment: Post-op Vital signs reviewed              Post-op Vital Signs: stable  Last Vitals:  Filed Vitals:   10/19/15 1216  BP: 111/65  Pulse: 64  Resp: 16    Complications: No apparent anesthesia complications

## 2015-10-19 NOTE — Transfer of Care (Signed)
Immediate Anesthesia Transfer of Care Note  Patient: Gerald Baker  Procedure(s) Performed: Procedure(s): CARDIOVERSION (N/A)  Patient Location: Endoscopy Unit  Anesthesia Type:General  Level of Consciousness: awake, alert  and oriented  Airway & Oxygen Therapy: Patient Spontanous Breathing  Post-op Assessment: Report given to RN, Post -op Vital signs reviewed and stable and Patient moving all extremities X 4  Post vital signs: Reviewed and stable  Last Vitals:  Filed Vitals:   10/19/15 1122  BP: 141/98  Pulse: 96  Resp: 16    Complications: No apparent anesthesia complications

## 2015-10-20 ENCOUNTER — Encounter (HOSPITAL_COMMUNITY): Payer: Self-pay | Admitting: Cardiovascular Disease

## 2015-10-24 ENCOUNTER — Ambulatory Visit (HOSPITAL_COMMUNITY)
Admission: RE | Admit: 2015-10-24 | Discharge: 2015-10-24 | Disposition: A | Discharge status: Home / Self Care | Payer: BLUE CROSS/BLUE SHIELD | Source: Ambulatory Visit | Attending: Cardiology | Admitting: Cardiology

## 2015-10-24 ENCOUNTER — Ambulatory Visit: Payer: Self-pay | Admitting: Cardiology

## 2015-10-24 VITALS — BP 130/80 | HR 61 | Ht 72.0 in | Wt 263.8 lb

## 2015-10-24 DIAGNOSIS — I4819 Other persistent atrial fibrillation: Secondary | ICD-10-CM

## 2015-10-24 DIAGNOSIS — I481 Persistent atrial fibrillation: Secondary | ICD-10-CM | POA: Insufficient documentation

## 2015-10-24 NOTE — Progress Notes (Signed)
Patient ID: Gerald Baker, male   DOB: 02-May-1951, 64 y.o.   MRN: [id number]     Primary Care Physician: Lillia MountainGRIFFIN,JOHN JOSEPH, MD Referring Physician: [facility] triage office Cardiologist: Dr. Ala DachSmith   Krikor Shearon Stalls Stanek is a 64 y.o. male with a h/o PAF, with 3 episodes since 2014. Last episode was in 2015 when he had afib x 10 days. He spontaneously converted to SR. He developed afib this time after a day of exertion and having 3 alcoholic drinks that pm. He has been in afib now x 5-6 days. He has not missed any doses of xarelto. He is on daily doses of metoprolol 100 mg bid and will take an extra 1/2 tab of metoprolol as needed. He does notice increased fatigue, dyspnea when he is in afib. Discussed antiarrhythmics vrs cardioversion and pt does not feel his burden is significant enough to warrant antiarrythmic at this point. He does not snore, is active, is on nutrasystem and is losing weight. No significant caffeine use, but does drink 1-3 alcoholic drinks a night.  He returns today after successful cardioversion 10/27. He has been maintaining SR. Again, it was discussed if pt wanted to try AAD, different drug options discussed. He would like to see how long SR will hold and if occurs again within a short period of time from cardioversion, will more seriously consider change of medical management..   Today, he denies symptoms of palpitations, chest pain, shortness of breath, orthopnea, PND, lower extremity edema, dizziness, presyncope, syncope, or neurologic sequela. The patient is tolerating medications without difficulties and is otherwise without complaint today.   Past Medical History  Diagnosis Date  . Dysrhythmia   . Diabetes mellitus without complication [facility](HCC)    Past Surgical History  Procedure Laterality Date  . Knee arthroscopy    . Cardioversion N/A 10/19/2015    Procedure: CARDIOVERSION;  Surgeon: Chilton Siiffany [place], MD;  [facility];  Service: Cardiovascular;  Laterality: N/A;      Current Outpatient Prescriptions  Medication Sig Dispense Refill  . metFORMIN (GLUCOPHAGE) 500 MG tablet Take 500 mg by mouth daily.     . metoprolol (LOPRESSOR) 100 MG tablet Take 1 tablet (100 mg total) by mouth 2 (two) times daily.    . rivaroxaban (XARELTO) 20 MG TABS tablet Take 1 tablet (20 mg total) by mouth daily with supper. 90 tablet 1   No current facility-administered medications for this encounter.    Allergen  . Altace [Ramipril]     . Cozaar [Losartan Potassium]     . Penicillins     . Sulfur       Social History   Social History  . Marital Status: Married    Spouse Name: N/A  . Number of Children: N/A  . Years of Education: N/A   Occupational History  . Not on file.   Social History Main Topics  . Smoking status: Former Games developermoker  . Smokeless tobacco: Not on file     Comment: Quit 1997  . Alcohol Use: Yes     Comment: daily  . Drug Use: No  . Sexual Activity: Not on file   Other Topics Concern  . Not on file   Social History Narrative    Family History  Problem Relation Age of Onset  . Heart disease Father   . Heart failure Father     ROS- All systems are reviewed and negative except as per the HPI above  Physical Exam: Filed Vitals:   10/24/15 1427  BP: 130/80  Pulse: 61  Height: 6' (1.829 m)  Weight: 263 lb 12.8 oz (119.659 kg)    GEN- The patient is well appearing, alert and oriented x 3 today.   Head- normocephalic, atraumatic Eyes-  Sclera clear, conjunctiva pink Ears- hearing intact Oropharynx- clear Neck- supple, no JVP Lymph- no cervical lymphadenopathy Lungs- Clear to ausculation bilaterally, normal work of breathing Heart-Regular rate and rhythm, no murmurs, rubs or gallops, PMI not laterally displaced GI- soft, NT, ND, + BS Extremities- no clubbing, cyanosis, or edema MS- no significant deformity or atrophy Skin- no rash or lesion Psych- euthymic mood, full affect Neuro- strength and sensation are  intact  EKG-Sinus rhythm 61 bpm, pr int 156 ms, qrs int 74 ms, qtc 410 ms  Echo- 10/30/11- Mild left atrial enlargement, EF 45-50% with mild LVH, trace MR  Assessment and Plan: 1. Persistent symptomatic afib Successful  DCCV No missed doses of xarelto Continue metoprolol Discussed antiarrythmic's but pt not interested in changing treatment approach at this time  F/u with Dr. Katrinka Blazing pending, one month afib clinic as needed   Lupita Leash C[facility] [address] [place], [place] [address] [phone]

## 2015-11-02 ENCOUNTER — Telehealth: Payer: Self-pay

## 2015-11-02 NOTE — Telephone Encounter (Signed)
Pt aware f/u appt with Dr.Smith scheduled on 12/13 @ 10:15am

## 2015-11-02 NOTE — Telephone Encounter (Signed)
Called pt to schedule a work in 1 month appt per Rivka Saferonna Carroll,NP. lmtcb

## 2015-11-06 NOTE — H&P (Signed)
   Gerald BaconHarold P Egelston is a 64 y.o. male who has presented today for surgery, with the diagnosis of atrial fibrillation. The various methods of treatment have been discussed with the patient and family. After consideration of risks, benefits and other options for treatment, the patient has consented to Procedure(s): TRANSESOPHAGEAL ECHOCARDIOGRAM (TEE) (N/A) as a surgical intervention . The patient's history has been reviewed, patient examined, no change in status, stable for surgery. I have reviewed the patient's chart and labs. Questions were answered to the patient's satisfaction.   Alim Cattell C. Duke Salviaandolph, MD

## 2015-12-05 ENCOUNTER — Other Ambulatory Visit: Payer: Self-pay | Admitting: Interventional Cardiology

## 2015-12-05 ENCOUNTER — Ambulatory Visit (INDEPENDENT_AMBULATORY_CARE_PROVIDER_SITE_OTHER): Payer: BLUE CROSS/BLUE SHIELD | Admitting: Interventional Cardiology

## 2015-12-05 ENCOUNTER — Encounter: Payer: Self-pay | Admitting: Interventional Cardiology

## 2015-12-05 VITALS — BP 144/92 | HR 58 | Ht 72.0 in | Wt 263.0 lb

## 2015-12-05 DIAGNOSIS — I10 Essential (primary) hypertension: Secondary | ICD-10-CM

## 2015-12-05 DIAGNOSIS — I480 Paroxysmal atrial fibrillation: Secondary | ICD-10-CM | POA: Diagnosis not present

## 2015-12-05 DIAGNOSIS — I5032 Chronic diastolic (congestive) heart failure: Secondary | ICD-10-CM | POA: Diagnosis not present

## 2015-12-05 DIAGNOSIS — Z7901 Long term (current) use of anticoagulants: Secondary | ICD-10-CM

## 2015-12-05 NOTE — Patient Instructions (Signed)
Medication Instructions:  Your physician recommends that you continue on your current medications as directed. Please refer to the Current Medication list given to you today.   Labwork: None ordered  Testing/Procedures: None ordered  Follow-Up: Your physician wants you to follow-up in: 6 months with Dr. Smith. You will receive a reminder letter in the mail two months in advance. If you don't receive a letter, please call our office to schedule the follow-up appointment.   Any Other Special Instructions Will Be Listed Below (If Applicable).     If you need a refill on your cardiac medications before your next appointment, please call your pharmacy.   

## 2015-12-05 NOTE — Progress Notes (Signed)
Cardiology Office Note   Date:  12/05/2015   ID:  Gerald Baker, DOB 1951/07/03, MRN [id number]  PCP:  Lillia Mountain, MD  Cardiologist:  Lesleigh Noe, MD   Chief Complaint  Patient presents with  . Coronary Artery Disease      History of Present Illness: Gerald Baker is a 64 y.o. male who presents for obesity, hypertension, and paroxysmal atrial fibrillation. Has complained of palpitations intermittently that can last up to hours. Repeat more recently had an episode that lasted 3 weeks and resulted in outpatient electrical cardioversion. Has been followed up in the atrial fibrillation clinic. They discussed antiarrhythmic therapy but it was refused. He is done well since the cardioversion. He does not believe atrial fibrillation has recurred. He is trying to lose weight. He may have 2 drinks of alcohol per night.    Past Medical History  Diagnosis Date  . Dysrhythmia   . Diabetes mellitus without complication [facility])     Past Surgical History  Procedure Laterality Date  . Knee arthroscopy    . Cardioversion N/A 10/19/2015    Procedure: CARDIOVERSION;  Surgeon: Chilton Si, MD;  Location: [facility] ENDOSCOPY;  Service: Cardiovascular;  Laterality: N/A;     Current Outpatient Prescriptions  Medication Sig Dispense Refill  . metFORMIN (GLUCOPHAGE) 500 MG tablet Take 500 mg by mouth daily.     . metoprolol (LOPRESSOR) 100 MG tablet Take 1 tablet (100 mg total) by mouth 2 (two) times daily.    . rivaroxaban (XARELTO) 20 MG TABS tablet Take 1 tablet (20 mg total) by mouth daily with supper. 90 tablet 1   No current facility-administered medications for this visit.    Allergies:   Altace; Cozaar; Penicillins; and Sulfur    Social History:  The patient  reports that he has quit smoking. He has never used smokeless tobacco. He reports that he drinks alcohol. He reports that he does not use illicit drugs.   Family History:  The patient's family history includes  Heart disease in his father; Heart failure in his father.    ROS:  Please see the history of present illness.   Otherwise, review of systems are positive for muscle aches and pains but otherwise unremarkable.   All other systems are reviewed and negative.    PHYSICAL EXAM: VS:  BP 144/92 mmHg  Pulse 58  Ht 6' (1.829 m)  Wt 263 lb (119.296 kg)  BMI 35.66 kg/m2 , BMI Body mass index is 35.66 kg/(m^2). GEN: Well nourished, well developed, in no acute distress HEENT: normal Neck: no JVD, carotid bruits, or masses Cardiac: RRR.  There is no murmur, rub, or gallop. There is no edema. Respiratory:  clear to auscultation bilaterally, normal work of breathing. GI: soft, nontender, nondistended, + BS MS: no deformity or atrophy Skin: warm and dry, no rash Neuro:  Strength and sensation are intact Psych: euthymic mood, full affect   EKG:  EKG is ordered today. The ekg reveals sinus bradycardia at 58 bpm   Recent Labs: 10/18/2015: BUN 12; Creatinine, Ser 0.83; Hemoglobin 15.4; Platelets 266; Potassium 4.5; Sodium 141; TSH 1.683    Lipid Panel No results found for: CHOL, TRIG, HDL, CHOLHDL, VLDL, LDLCALC, LDLDIRECT    Wt Readings from Last 3 Encounters:  12/05/15 263 lb (119.296 kg)  10/24/15 263 lb 12.8 oz (119.659 kg)  10/18/15 260 lb 6.4 oz (118.117 kg)      Other studies Reviewed: Additional studies/ records that were reviewed today include:  Electronic medical record. The findings include cardioversion and atrial fibrillation clinic notes were reviewed..    ASSESSMENT AND PLAN:  1. Paroxysmal atrial fibrillation (HCC) Recent cardioversion now in rhythm and doing well. Currently, I agree with plan to avoid antiarrhythmic drugs if possible. This was discussed with the patient. I informed him that he will likely lead to us for greater control of the situation if AF continues to recur.  2. Chronic diastolic heart failure (HCC) No evidence of volume overload  3. Essential  hypertension Stable  4. Chronic anticoagulation No bleeding, palpitations    Current medicines are reviewed at length with the patient today.  The patient has the following concerns regarding medicines: None and does not want antiarrhythmic therapy at this time.  The following changes/actions have been instituted:    Clinical observation  Follow-up in one year  Decrease alcohol intake  Labs/ tests ordered today include:  No orders of the defined types were placed in this encounter.     Disposition:   FU with HS in 6 months  Signed, Lesleigh NoeSMITH III,Livan Hires W, MD  12/05/2015 10:42 AM    [facility] [address], [place], [place]  [address] Phone: [phone]; Fax: [phone]

## 2016-07-24 ENCOUNTER — Encounter: Payer: Self-pay | Admitting: Interventional Cardiology

## 2016-08-02 ENCOUNTER — Ambulatory Visit: Payer: BLUE CROSS/BLUE SHIELD | Admitting: Interventional Cardiology

## 2016-08-09 ENCOUNTER — Ambulatory Visit (INDEPENDENT_AMBULATORY_CARE_PROVIDER_SITE_OTHER): Payer: BLUE CROSS/BLUE SHIELD | Admitting: Interventional Cardiology

## 2016-08-09 ENCOUNTER — Encounter: Payer: Self-pay | Admitting: Interventional Cardiology

## 2016-08-09 VITALS — BP 160/98 | HR 66 | Ht 72.0 in | Wt 281.1 lb

## 2016-08-09 DIAGNOSIS — E785 Hyperlipidemia, unspecified: Secondary | ICD-10-CM | POA: Diagnosis not present

## 2016-08-09 DIAGNOSIS — Z7901 Long term (current) use of anticoagulants: Secondary | ICD-10-CM

## 2016-08-09 DIAGNOSIS — I5032 Chronic diastolic (congestive) heart failure: Secondary | ICD-10-CM | POA: Diagnosis not present

## 2016-08-09 DIAGNOSIS — I10 Essential (primary) hypertension: Secondary | ICD-10-CM

## 2016-08-09 DIAGNOSIS — I480 Paroxysmal atrial fibrillation: Secondary | ICD-10-CM | POA: Diagnosis not present

## 2016-08-09 NOTE — Patient Instructions (Addendum)
Medication Instructions:  Your physician recommends that you continue on your current medications as directed. Please refer to the Current Medication list given to you today.   Labwork: None ordered  Testing/Procedures: Your physician has recommended that you have a sleep study. CALL OUR OFFICE AND LET US KNOW IF YOU DECIDE TO DO THIS.  This test records several body functions during sleep, including: brain activity, eye movement, oxygen and carbon dioxide blood levels, heart rate and rhythm, breathing rate and rhythm, the flow of air through your mouth and nose, snoring, body muscle movements, and chest and belly movement.   Follow-Up: Your physician wants you to follow-up in: 1 YEAR WITH DR. Marlou StarksSMITH  You will receive a reminder letter in the mail two months in advance. If you don't receive a letter, please call our office to schedule the follow-up appointment.    Any Other Special Instructions Will Be Listed Below (If Applicable).   Your physician has requested that you regularly monitor and record your blood pressure readings at home.(ABOUT 5 TIMES IN A MONTH) If your blood pressure runs over 140/90, then call our office.    Please use the same machine at the same time of day to check your readings and record them to bring to your follow-up visit.   Low-Sodium Eating Plan Sodium raises blood pressure and causes water to be held in the body. Getting less sodium from food will help lower your blood pressure, reduce any swelling, and protect your heart, liver, and kidneys. We get sodium by adding salt (sodium chloride) to food. Most of our sodium comes from canned, boxed, and frozen foods. Restaurant foods, fast foods, and pizza are also very high in sodium. Even if you take medicine to lower your blood pressure or to reduce fluid in your body, getting less sodium from your food is important. WHAT IS MY PLAN? Most people should limit their sodium intake to 2,300 mg a day. Your health care  provider recommends that you limit your sodium intake to 2 GRAMS a day.  WHAT DO I NEED TO KNOW ABOUT THIS EATING PLAN? For the low-sodium eating plan, you will follow these general guidelines:  Choose foods with a % Daily Value for sodium of less than 5% (as listed on the food label).   Use salt-free seasonings or herbs instead of table salt or sea salt.   Check with your health care provider or pharmacist before using salt substitutes.   Eat fresh foods.  Eat more vegetables and fruits.  Limit canned vegetables. If you do use them, rinse them well to decrease the sodium.   Limit cheese to 1 oz (28 g) per day.   Eat lower-sodium products, often labeled as "lower sodium" or "no salt added."  Avoid foods that contain monosodium glutamate (MSG). MSG is sometimes added to [place] food and some canned foods.  Check food labels (Nutrition Facts labels) on foods to learn how much sodium is in one serving.  Eat more home-cooked food and less restaurant, buffet, and fast food.  When eating at a restaurant, ask that your food be prepared with less salt, or no salt if possible.  HOW DO I READ FOOD LABELS FOR SODIUM INFORMATION? The Nutrition Facts label lists the amount of sodium in one serving of the food. If you eat more than one serving, you must multiply the listed amount of sodium by the number of servings. Food labels may also identify foods as:  Sodium free--Less than 5 mg in  a serving.  Very low sodium--35 mg or less in a serving.  Low sodium--140 mg or less in a serving.  Light in sodium--50% less sodium in a serving. For example, if a food that usually has 300 mg of sodium is changed to become light in sodium, it will have 150 mg of sodium.  Reduced sodium--25% less sodium in a serving. For example, if a food that usually has 400 mg of sodium is changed to reduced sodium, it will have 300 mg of sodium. WHAT FOODS CAN I EAT? Grains Low-sodium cereals, including  oats, puffed wheat and rice, and shredded wheat cereals. Low-sodium crackers. Unsalted rice and pasta. Lower-sodium bread.  Vegetables Frozen or fresh vegetables. Low-sodium or reduced-sodium canned vegetables. Low-sodium or reduced-sodium tomato sauce and paste. Low-sodium or reduced-sodium tomato and vegetable juices.  Fruits Fresh, frozen, and canned fruit. Fruit juice.  Meat and Other Protein Products Low-sodium canned tuna and salmon. Fresh or frozen meat, poultry, seafood, and fish. Lamb. Unsalted nuts. Dried beans, peas, and lentils without added salt. Unsalted canned beans. Homemade soups without salt. Eggs.  Dairy Milk. Soy milk. Ricotta cheese. Low-sodium or reduced-sodium cheeses. Yogurt.  Condiments Fresh and dried herbs and spices. Salt-free seasonings. Onion and garlic powders. Low-sodium varieties of mustard and ketchup. Fresh or refrigerated horseradish. Lemon juice.  Fats and Oils Reduced-sodium salad dressings. Unsalted butter.  Other Unsalted popcorn and pretzels.  The items listed above may not be a complete list of recommended foods or beverages. Contact your dietitian for more options. WHAT FOODS ARE NOT RECOMMENDED? Grains Instant hot cereals. Bread stuffing, pancake, and biscuit mixes. Croutons. Seasoned rice or pasta mixes. Noodle soup cups. Boxed or frozen macaroni and cheese. Self-rising flour. Regular salted crackers. Vegetables Regular canned vegetables. Regular canned tomato sauce and paste. Regular tomato and vegetable juices. Frozen vegetables in sauces. Salted [place] fries. Olives. Rosita FirePickles. Relishes. Sauerkraut. Salsa. Meat and Other Protein Products Salted, canned, smoked, spiced, or pickled meats, seafood, or fish. Bacon, ham, sausage, hot dogs, corned beef, chipped beef, and packaged luncheon meats. Salt pork. Jerky. Pickled herring. Anchovies, regular canned tuna, and sardines. Salted nuts. Dairy Processed cheese and cheese spreads. Cheese  curds. Blue cheese and cottage cheese. Buttermilk.  Condiments Onion and garlic salt, seasoned salt, table salt, and sea salt. Canned and packaged gravies. Worcestershire sauce. Tartar sauce. Barbecue sauce. Teriyaki sauce. Soy sauce, including reduced sodium. Steak sauce. Fish sauce. Oyster sauce. Cocktail sauce. Horseradish that you find on the shelf. Regular ketchup and mustard. Meat flavorings and tenderizers. Bouillon cubes. Hot sauce. Tabasco sauce. Marinades. Taco seasonings. Relishes. Fats and Oils Regular salad dressings. Salted butter. Margarine. Ghee. Bacon fat.  Other Potato and tortilla chips. Corn chips and puffs. Salted popcorn and pretzels. Canned or dried soups. Pizza. Frozen entrees and pot pies.  The items listed above may not be a complete list of foods and beverages to avoid. Contact your dietitian for more information.   This information is not intended to replace advice given to you by your health care provider. Make sure you discuss any questions you have with your health care provider.   Document Released: 05/31/2002 Document Revised: 12/30/2014 Document Reviewed: 10/13/2013 Elsevier Interactive Patient Education [employer]2016 Elsevier Inc.     If you need a refill on your cardiac medications before your next appointment, please call your pharmacy.

## 2016-08-09 NOTE — Progress Notes (Signed)
Cardiology Office Note    Date:  08/09/2016   ID:  Gerald BaconHarold P Baker, DOB 22-Feb-1951, MRN [id number]  PCP:  Lillia MountainGRIFFIN,Gerald JOSEPH, MD  Cardiologist: Lesleigh NoeHenry W Michaelle Bottomley III, MD   Chief Complaint  Patient presents with  . Atrial Fibrillation  . Congestive Heart Failure    History of Present Illness:  Gerald Baker is a 65 y.o. male follow-up of paroxysmal atrial fibrillation, essential hypertension, and chronic anticoagulation therapy.  Had 7-8 days of atrial fibrillation earlier this summer. Feels he has been back and rhythm now for several weeks. His last 2 episodes have occurred at night and he awakens in the morning with atrial fib. When he is in atrial fibrillation he is fatigued. He feels back to normal strength at this time.  The complications on anticoagulation therapy.  Past Medical History:  Diagnosis Date  . Diabetes mellitus without complication (HCC)   . Dysrhythmia     Past Surgical History:  Procedure Laterality Date  . CARDIOVERSION N/A 10/19/2015   Procedure: CARDIOVERSION;  Surgeon: Chilton Siiffany Neosho, MD;  [facility];  Service: Cardiovascular;  Laterality: N/A;  . KNEE ARTHROSCOPY      Current Medications: Outpatient Medications Prior to Visit  Medication Sig Dispense Refill  . metoprolol (LOPRESSOR) 100 MG tablet Take 1 tablet (100 mg total) by mouth 2 (two) times daily.    [name] tablet TAKE 1 TABLET (20 MG TOTAL) BY MOUTH DAILY WITH SUPPER. 90 tablet 3  . metFORMIN (GLUCOPHAGE) 500 MG tablet Take 500 mg by mouth daily.      No facility-administered medications prior to visit.      Allergies:   Altace [ramipril]; Cozaar [losartan potassium]; Penicillins; and Sulfur   Social History   Social History  . Marital status: Married    Spouse name: N/A  . Number of children: N/A  . Years of education: N/A   Social History Main Topics  . Smoking status: Former Games developermoker  . Smokeless tobacco: Never Used     Comment: Quit 1997  .  Alcohol use 0.0 oz/week     Comment: daily  . Drug use: No  . Sexual activity: Not Asked   Other Topics Concern  . None   Social History Narrative  . None     Family History:  The patient's family history includes Heart disease in his father; Heart failure in his father.   ROS:   Please see the history of present illness.    Fatigue and lingering lethargy after the most recent episode of atrial fib. This is finally resolved. Cannot sleep on his back. Doesn't believe he snores. Previously refused sleep study.  All other systems reviewed and are negative.   PHYSICAL EXAM:   VS:  BP (!) 160/98   Pulse 66   Ht 6' (1.829 m)   Wt 281 lb 1.9 oz (127.5 kg)   BMI 38.13 kg/m    GEN: Well nourished, well developed, in no acute distress  HEENT: normal  Neck: no JVD, carotid bruits, or masses Cardiac: RRR; no murmurs, rubs, or gallops,no edema  Respiratory:  clear to auscultation bilaterally, normal work of breathing GI: soft, nontender, nondistended, + BS MS: no deformity or atrophy  Skin: warm and dry, no rash Neuro:  Alert and Oriented x 3, Strength and sensation are intact Psych: euthymic mood, full affect  Wt Readings from Last 3 Encounters:  08/09/16 281 lb 1.9 oz (127.5 kg)  12/05/15 263 lb (119.3 kg)  10/24/15 263 lb 12.8 oz (119.7 kg)      Studies/Labs Reviewed:   EKG:  EKG  Not done  Recent Labs: 10/18/2015: BUN 12; Creatinine, Ser 0.83; Hemoglobin 15.4; Platelets 266; Potassium 4.5; Sodium 141; TSH 1.683   Lipid Panel No results found for: CHOL, TRIG, HDL, CHOLHDL, VLDL, LDLCALC, LDLDIRECT  Additional studies/ records that were reviewed today include:  none    ASSESSMENT:    1. Paroxysmal atrial fibrillation (HCC)   2. Chronic diastolic heart failure (HCC)   3. Chronic anticoagulation   4. Essential hypertension   5. Dyslipidemia      PLAN:  In order of problems listed above:  1. Continues to have episodes second last weeks. Unusual in that after  prolonged periods of atrial fibrillation he can refer to normal sinus rhythm. I am concerned that he has sleep apnea. I recommended a sleep study. He will call me back and let me know if we can set this up. He is concerned about out of pocket cost. 2. No evidence of volume overload. Discussed low salt diet. 3. Chronic anticoagulation therapy will be continued. No side effects noted. No complications noted. 4. Low salt diet, weight loss, and aerobic exercises were advocated. Needs to monitor his blood pressure several times per month. The target is less than 140/90 mmHg.     Medication Adjustments/Labs and Tests Ordered: Current medicines are reviewed at length with the patient today.  Concerns regarding medicines are outlined above.  Medication changes, Labs and Tests ordered today are listed in the Patient Instructions below. Patient Instructions  Medication Instructions:  Your physician recommends that you continue on your current medications as directed. Please refer to the Current Medication list given to you today.   Labwork: None ordered  Testing/Procedures: Your physician has recommended that you have a sleep study. CALL OUR OFFICE AND LET us KNOW IF YOU DECIDE TO DO THIS.  This test records several body functions during sleep, including: brain activity, eye movement, oxygen and carbon dioxide blood levels, heart rate and rhythm, breathing rate and rhythm, the flow of air through your mouth and nose, snoring, body muscle movements, and chest and belly movement.   Follow-Up: Your physician wants you to follow-up in: 1 YEAR WITH DR. Marlou Starks will receive a reminder letter in the mail two months in advance. If you don't receive a letter, please call our office to schedule the follow-up appointment.    Any Other Special Instructions Will Be Listed Below (If Applicable).   Your physician has requested that you regularly monitor and record your blood pressure readings at home.(ABOUT  5 TIMES IN A MONTH) If your blood pressure runs over 140/90, then call our office.    Please use the same machine at the same time of day to check your readings and record them to bring to your follow-up visit.   Low-Sodium Eating Plan Sodium raises blood pressure and causes water to be held in the body. Getting less sodium from food will help lower your blood pressure, reduce any swelling, and protect your heart, liver, and kidneys. We get sodium by adding salt (sodium chloride) to food. Most of our sodium comes from canned, boxed, and frozen foods. Restaurant foods, fast foods, and pizza are also very high in sodium. Even if you take medicine to lower your blood pressure or to reduce fluid in your body, getting less sodium from your food is important. WHAT IS MY PLAN? Most people should limit their sodium  intake to 2,300 mg a day. Your health care provider recommends that you limit your sodium intake to 2 GRAMS a day.  WHAT DO I NEED TO KNOW ABOUT THIS EATING PLAN? For the low-sodium eating plan, you will follow these general guidelines:  Choose foods with a % Daily Value for sodium of less than 5% (as listed on the food label).   Use salt-free seasonings or herbs instead of table salt or sea salt.   Check with your health care provider or pharmacist before using salt substitutes.   Eat fresh foods.  Eat more vegetables and fruits.  Limit canned vegetables. If you do use them, rinse them well to decrease the sodium.   Limit cheese to 1 oz (28 g) per day.   Eat lower-sodium products, often labeled as "lower sodium" or "no salt added."  Avoid foods that contain monosodium glutamate (MSG). MSG is sometimes added to [place] food and some canned foods.  Check food labels (Nutrition Facts labels) on foods to learn how much sodium is in one serving.  Eat more home-cooked food and less restaurant, buffet, and fast food.  When eating at a restaurant, ask that your food be prepared  with less salt, or no salt if possible.  HOW DO I READ FOOD LABELS FOR SODIUM INFORMATION? The Nutrition Facts label lists the amount of sodium in one serving of the food. If you eat more than one serving, you must multiply the listed amount of sodium by the number of servings. Food labels may also identify foods as:  Sodium free--Less than 5 mg in a serving.  Very low sodium--35 mg or less in a serving.  Low sodium--140 mg or less in a serving.  Light in sodium--50% less sodium in a serving. For example, if a food that usually has 300 mg of sodium is changed to become light in sodium, it will have 150 mg of sodium.  Reduced sodium--25% less sodium in a serving. For example, if a food that usually has 400 mg of sodium is changed to reduced sodium, it will have 300 mg of sodium. WHAT FOODS CAN I EAT? Grains Low-sodium cereals, including oats, puffed wheat and rice, and shredded wheat cereals. Low-sodium crackers. Unsalted rice and pasta. Lower-sodium bread.  Vegetables Frozen or fresh vegetables. Low-sodium or reduced-sodium canned vegetables. Low-sodium or reduced-sodium tomato sauce and paste. Low-sodium or reduced-sodium tomato and vegetable juices.  Fruits Fresh, frozen, and canned fruit. Fruit juice.  Meat and Other Protein Products Low-sodium canned tuna and salmon. Fresh or frozen meat, poultry, seafood, and fish. Lamb. Unsalted nuts. Dried beans, peas, and lentils without added salt. Unsalted canned beans. Homemade soups without salt. Eggs.  Dairy Milk. Soy milk. Ricotta cheese. Low-sodium or reduced-sodium cheeses. Yogurt.  Condiments Fresh and dried herbs and spices. Salt-free seasonings. Onion and garlic powders. Low-sodium varieties of mustard and ketchup. Fresh or refrigerated horseradish. Lemon juice.  Fats and Oils Reduced-sodium salad dressings. Unsalted butter.  Other Unsalted popcorn and pretzels.  The items listed above may not be a complete list of  recommended foods or beverages. Contact your dietitian for more options. WHAT FOODS ARE NOT RECOMMENDED? Grains Instant hot cereals. Bread stuffing, pancake, and biscuit mixes. Croutons. Seasoned rice or pasta mixes. Noodle soup cups. Boxed or frozen macaroni and cheese. Self-rising flour. Regular salted crackers. Vegetables Regular canned vegetables. Regular canned tomato sauce and paste. Regular tomato and vegetable juices. Frozen vegetables in sauces. Salted [place] fries. Olives. Rosita Fire. Relishes. Sauerkraut. Salsa. Meat and Other  Protein Products Salted, canned, smoked, spiced, or pickled meats, seafood, or fish. Baker, ham, sausage, hot dogs, corned beef, chipped beef, and packaged luncheon meats. Salt pork. Jerky. Pickled herring. Anchovies, regular canned tuna, and sardines. Salted nuts. Dairy Processed cheese and cheese spreads. Cheese curds. Blue cheese and cottage cheese. Buttermilk.  Condiments Onion and garlic salt, seasoned salt, table salt, and sea salt. Canned and packaged gravies. Worcestershire sauce. Tartar sauce. Barbecue sauce. Teriyaki sauce. Soy sauce, including reduced sodium. Steak sauce. Fish sauce. Oyster sauce. Cocktail sauce. Horseradish that you find on the shelf. Regular ketchup and mustard. Meat flavorings and tenderizers. Bouillon cubes. Hot sauce. Tabasco sauce. Marinades. Taco seasonings. Relishes. Fats and Oils Regular salad dressings. Salted butter. Margarine. Ghee. Baker fat.  Other Potato and tortilla chips. Corn chips and puffs. Salted popcorn and pretzels. Canned or dried soups. Pizza. Frozen entrees and pot pies.  The items listed above may not be a complete list of foods and beverages to avoid. Contact your dietitian for more information.   This information is not intended to replace advice given to you by your health care provider. Make sure you discuss any questions you have with your health care provider.   Document Released: 05/31/2002  Document Revised: 12/30/2014 Document Reviewed: 10/13/2013 Elsevier Interactive Patient Education [employer]2016 Elsevier Inc.     If you need a refill on your cardiac medications before your next appointment, please call your pharmacy.      Signed, Lesleigh NoeHenry W Evaleigh Mccamy III, MD  08/09/2016 10:26 AM    [facility] [address], [place], [place]  [address] Phone: [phone]) [phone]; Fax: [phone]) [phone]

## 2016-09-24 ENCOUNTER — Telehealth (HOSPITAL_COMMUNITY): Payer: Self-pay | Admitting: *Deleted

## 2016-09-24 NOTE — Telephone Encounter (Signed)
Pt called in stating he has been in afib for the last 12 days and is wanting to be checked out. He has not missed any doses of his xarelto or metoprolol. Saw his PCP yesterday and encouraged him to call and be seen. Appt made for 10/3

## 2016-09-25 ENCOUNTER — Ambulatory Visit (HOSPITAL_COMMUNITY)
Admission: RE | Admit: 2016-09-25 | Discharge: 2016-09-25 | Disposition: A | Discharge status: Home / Self Care | Payer: BLUE CROSS/BLUE SHIELD | Source: Ambulatory Visit | Attending: Nurse Practitioner | Admitting: Nurse Practitioner

## 2016-09-25 ENCOUNTER — Encounter (HOSPITAL_COMMUNITY): Payer: Self-pay | Admitting: Nurse Practitioner

## 2016-09-25 VITALS — BP 108/76 | HR 138 | Ht 72.0 in | Wt 282.4 lb

## 2016-09-25 DIAGNOSIS — E119 Type 2 diabetes mellitus without complications: Secondary | ICD-10-CM | POA: Insufficient documentation

## 2016-09-25 DIAGNOSIS — Z6838 Body mass index (BMI) 38.0-38.9, adult: Secondary | ICD-10-CM | POA: Diagnosis not present

## 2016-09-25 DIAGNOSIS — I481 Persistent atrial fibrillation: Secondary | ICD-10-CM

## 2016-09-25 DIAGNOSIS — I480 Paroxysmal atrial fibrillation: Secondary | ICD-10-CM

## 2016-09-25 DIAGNOSIS — I4819 Other persistent atrial fibrillation: Secondary | ICD-10-CM

## 2016-09-25 DIAGNOSIS — Z87891 Personal history of nicotine dependence: Secondary | ICD-10-CM | POA: Diagnosis not present

## 2016-09-25 DIAGNOSIS — Z7984 Long term (current) use of oral hypoglycemic drugs: Secondary | ICD-10-CM | POA: Diagnosis not present

## 2016-09-25 DIAGNOSIS — Z9889 Other specified postprocedural states: Secondary | ICD-10-CM | POA: Insufficient documentation

## 2016-09-25 DIAGNOSIS — Z7901 Long term (current) use of anticoagulants: Secondary | ICD-10-CM | POA: Diagnosis not present

## 2016-09-25 DIAGNOSIS — R0683 Snoring: Secondary | ICD-10-CM | POA: Diagnosis not present

## 2016-09-25 HISTORY — DX: Other persistent atrial fibrillation: I48.19

## 2016-09-25 LAB — BASIC METABOLIC PANEL
ANION GAP: 7 (ref 5–15)
BUN: 14 mg/dL (ref 6–20)
CO2: 27 mmol/L (ref 22–32)
CREATININE: 0.85 mg/dL (ref 0.61–1.24)
Calcium: 9.2 mg/dL (ref 8.9–10.3)
Chloride: 103 mmol/L (ref 101–111)
Glucose, Bld: 118 mg/dL — ABNORMAL HIGH (ref 65–99)
Potassium: 4.6 mmol/L (ref 3.5–5.1)
SODIUM: 137 mmol/L (ref 135–145)

## 2016-09-25 LAB — CBC
HEMATOCRIT: 46.2 % (ref 39.0–52.0)
Hemoglobin: 15.4 g/dL (ref 13.0–17.0)
MCH: 30 pg (ref 26.0–34.0)
MCHC: 33.3 g/dL (ref 30.0–36.0)
MCV: 90.1 fL (ref 78.0–100.0)
Platelets: 309 10*3/uL (ref 150–400)
RBC: 5.13 MIL/uL (ref 4.22–5.81)
RDW: 13.1 % (ref 11.5–15.5)
WBC: 10.5 10*3/uL (ref 4.0–10.5)

## 2016-09-25 MED ORDER — FLECAINIDE ACETATE 150 MG PO TABS: ORAL_TABLET | ORAL | 0 refills | Status: DC

## 2016-09-25 NOTE — Patient Instructions (Addendum)
Cardioversion scheduled for Monday, October 9th  - Ar[facility] at the [facility] and go to admitting at [facility]7AM  -Do not eat or drink anything after midnight the night prior to your procedure.  - Take all your medication with a sip of water prior to arrival.  - You will not be able to drive home after your procedure.   Your physician has recommended you make the following change in your medication:  1)Take 2 tablets (300mg ) of flecainide tonight. May repeat if still in afib in 36 hours.

## 2016-09-26 ENCOUNTER — Encounter (HOSPITAL_COMMUNITY): Payer: Self-pay | Admitting: Nurse Practitioner

## 2016-09-26 ENCOUNTER — Ambulatory Visit (HOSPITAL_COMMUNITY)
Admission: RE | Admit: 2016-09-26 | Discharge: 2016-09-26 | Disposition: A | Discharge status: Home / Self Care | Payer: BLUE CROSS/BLUE SHIELD | Source: Ambulatory Visit | Attending: Nurse Practitioner | Admitting: Nurse Practitioner

## 2016-09-26 DIAGNOSIS — I480 Paroxysmal atrial fibrillation: Secondary | ICD-10-CM | POA: Diagnosis present

## 2016-09-26 NOTE — Progress Notes (Signed)
Primary Care Physician: Lillia Mountain, MD Primary Cardiologist: Dr Ala Dach Gerald Baker is a 65 y.o. male with a history of  persistent atrial fibrillation who presents for follow up in the [facility].  He recently saw Dr Katrinka Blazing and was doing well.  Unfortunately, over the past 12 days, he has been back in afib.  He is unaware of triggers/ precipitants.  He has had tachypalpitations and fatigue.  Today, he  denies symptoms of chest pain, shortness of breath, orthopnea, PND, lower extremity edema, dizziness, presyncope, syncope, snoring, daytime somnolence, bleeding, or neurologic sequela. The patient is tolerating medications without difficulties and is otherwise without complaint today.    Atrial Fibrillation Risk Factors:  He snores.  Sleep study has been ordered by Dr Katrinka Blazing however patient did not have the study.  he does not have a history of rheumatic fever.  he does not have a history of alcohol use.  he has a BMI of Body mass index is 38.3 kg/m.Marland Kitchen Filed Weights   09/25/16 1611  Weight: 282 lb 6.4 oz (128.1 kg)    LA size: last echo 2012   Atrial Fibrillation Management history:  Previous antiarrhythmic drugs: none  Previous cardioversions: 10/16  Previous ablations: none  CHADS2VASC score: 2  Anticoagulation history: xarelto   Past Medical History:  Diagnosis Date  . Diabetes mellitus without complication (HCC)   . Dysrhythmia    Past Surgical History:  Procedure Laterality Date  . CARDIOVERSION N/A 10/19/2015   Procedure: CARDIOVERSION;  Surgeon: Chilton Si, MD;  Location: [facility] ENDOSCOPY;  Service: Cardiovascular;  Laterality: N/A;  . KNEE ARTHROSCOPY      Current Outpatient Prescriptions  Medication Sig Dispense Refill  . metFORMIN (GLUCOPHAGE) 500 MG tablet Take 1,000 mg by mouth 2 (two) times daily with a meal.     . metoprolol (LOPRESSOR) 100 MG tablet Take 1 tablet (100 mg total) by mouth 2 (two) times  daily.    [name] 20 MG TABS tablet TAKE 1 TABLET (20 MG TOTAL) BY MOUTH DAILY WITH SUPPER. 90 tablet 3  . flecainide (TAMBOCOR) 150 MG tablet Take 2 tablets (300mg ) by mouth once may repeat in 36 hours for afib. 12 tablet 0   No current facility-administered medications for this encounter.       Social History   Social History  . Marital status: Married    Spouse name: N/A  . Number of children: N/A  . Years of education: N/A   Occupational History  . Not on file.   Social History Main Topics  . Smoking status: Former Games developer  . Smokeless tobacco: Never Used     Comment: Quit 1997  . Alcohol use 0.0 oz/week     Comment: daily  . Drug use: No  . Sexual activity: Not on file   Other Topics Concern  . Not on file   Social History Narrative  . No narrative on file    Family History  Problem Relation Age of Onset  . Heart disease Father   . Heart failure Father     ROS- All systems are reviewed and negative except as per the HPI above.  Physical Exam: Vitals:   09/25/16 1611  BP: 108/76  Pulse: (!) 138  Weight: 282 lb 6.4 oz (128.1 kg)  Height: 6' (1.829 m)    GEN- The patient is well appearing, alert and oriented x 3 today.   Head- normocephalic, atraumatic Eyes-  Sclera clear, conjunctiva  pink Ears- hearing intact Oropharynx- clear Neck- supple  Lungs- Clear to ausculation bilaterally, normal work of breathing Heart- irregular rate and rhythm, no murmurs, rubs or gallops  GI- soft, NT, ND, + BS Extremities- no clubbing, cyanosis, or edema MS- no significant deformity or atrophy Skin- no rash or lesion Psych- euthymic mood, full affect Neuro- strength and sensation are intact  Wt Readings from Last 3 Encounters:  09/25/16 282 lb 6.4 oz (128.1 kg)  08/09/16 281 lb 1.9 oz (127.5 kg)  12/05/15 263 lb (119.3 kg)    EKG today demonstrates afib Echo 2012 demonstrated EF 45-50% with mild LVH  Epic records are reviewed at length today  Assessment  and Plan:  1. persistent atrial fibrillation He has rare episodes of afib.  He has now been in AF for 12 days.  Options were discussed at length today.  Risks and benefits of flecainide as a pill in pocket medicine were discussed.  He would like to proceed.  He may take flecainide 300mg  po x 1.  May be repeated once in 36 hours.  IF he does not return to sinus, will anticipate cardioversion early next week.  He will follow-up in AF clinic in 1 week [facility] Over[facility] for echo.  Once in sinus rhythm, will repeat an echo.  2. Morbid obesity Body mass index is 38.3 kg/m. lifestyle modification was discussed at length including regular exercise and weight reduction.  3. snoring The importance of following up with sleep study as advised by Dr Katrinka BlazingSmith was discussed with the patient today.  He states that he is now willing to proceed with sleep study.  Return to AF clinic in 1-2 weeks. Follow-up with Dr Katrinka BlazingSmith as scheduled.  Hillis RangeJames Damarri Rampy, MD

## 2016-09-26 NOTE — Progress Notes (Signed)
Pt in for EKG and BP check since feeling he may be in sinus rhythm.  Pt BP 124/84 EKG to be reviewed by Gypsy BalsamAmber Seiler, NP

## 2016-09-26 NOTE — Progress Notes (Signed)
Pt back in SR today by EKG.  Intervals normal.  Will reschedule appointment for 2 weeks to 1 month and cancel cardioversion for next week.  Pt advised ok to take pill in the pocket Flecainide as needed but not to take more than once every 36 hours. Also advised to let the AF clinic know when he required Flecainide to help judge AF burden.  Follow up in 4 weeks in AF clinic.   Gypsy BalsamAmber Vivek Grealish, NP 09/26/2016 11:13 AM

## 2016-09-27 ENCOUNTER — Telehealth: Payer: Self-pay | Admitting: Interventional Cardiology

## 2016-09-27 DIAGNOSIS — I480 Paroxysmal atrial fibrillation: Secondary | ICD-10-CM

## 2016-09-27 NOTE — Telephone Encounter (Signed)
Spoke with pt and he states that he is now ready to proceed with having a sleep study.  Advised pt that I will place order and once approved by insurance he will receive a call to schedule his sleep study.  Pt verbalized understanding.

## 2016-09-27 NOTE — Telephone Encounter (Signed)
No precert required with patient's insurance - will get him scheduled. Patient is aware that he will get a packet of information from the sleep center with his appointment date.

## 2016-09-27 NOTE — Telephone Encounter (Signed)
New message    Patient calling ready to set up sleep study.

## 2016-09-30 ENCOUNTER — Encounter (HOSPITAL_COMMUNITY): Payer: Self-pay

## 2016-09-30 ENCOUNTER — Ambulatory Visit (HOSPITAL_COMMUNITY): Admit: 2016-09-30 | Payer: BLUE CROSS/BLUE SHIELD | Admitting: Cardiovascular Disease

## 2016-09-30 SURGERY — CARDIOVERSION
Anesthesia: Monitor Anesthesia Care

## 2016-10-07 ENCOUNTER — Ambulatory Visit (HOSPITAL_COMMUNITY): Payer: BLUE CROSS/BLUE SHIELD | Admitting: Nurse Practitioner

## 2016-10-29 ENCOUNTER — Ambulatory Visit (HOSPITAL_BASED_OUTPATIENT_CLINIC_OR_DEPARTMENT_OTHER)
Admission: RE | Admit: 2016-10-29 | Discharge: 2016-10-29 | Disposition: A | Discharge status: Home / Self Care | Payer: BLUE CROSS/BLUE SHIELD | Source: Ambulatory Visit | Attending: Nurse Practitioner | Admitting: Nurse Practitioner

## 2016-10-29 ENCOUNTER — Other Ambulatory Visit: Payer: Self-pay

## 2016-10-29 ENCOUNTER — Encounter (HOSPITAL_COMMUNITY): Payer: Self-pay | Admitting: Nurse Practitioner

## 2016-10-29 ENCOUNTER — Ambulatory Visit (HOSPITAL_COMMUNITY)
Admission: RE | Admit: 2016-10-29 | Discharge: 2016-10-29 | Disposition: A | Discharge status: Home / Self Care | Payer: BLUE CROSS/BLUE SHIELD | Source: Ambulatory Visit | Attending: Nurse Practitioner | Admitting: Nurse Practitioner

## 2016-10-29 VITALS — BP 152/92 | HR 67 | Ht 72.0 in | Wt 283.8 lb

## 2016-10-29 DIAGNOSIS — I480 Paroxysmal atrial fibrillation: Secondary | ICD-10-CM | POA: Diagnosis not present

## 2016-10-29 DIAGNOSIS — E119 Type 2 diabetes mellitus without complications: Secondary | ICD-10-CM | POA: Diagnosis not present

## 2016-10-29 DIAGNOSIS — Z6838 Body mass index (BMI) 38.0-38.9, adult: Secondary | ICD-10-CM | POA: Diagnosis not present

## 2016-10-29 DIAGNOSIS — I4891 Unspecified atrial fibrillation: Secondary | ICD-10-CM | POA: Diagnosis present

## 2016-10-29 DIAGNOSIS — I119 Hypertensive heart disease without heart failure: Secondary | ICD-10-CM | POA: Diagnosis not present

## 2016-10-29 MED ORDER — PERFLUTREN LIPID MICROSPHERE
1.0000 mL | INTRAVENOUS | Status: AC | PRN
  Administered 2016-10-29: 2 mL via INTRAVENOUS
  Filled 2016-10-29: qty 10

## 2016-10-29 MED ORDER — PERFLUTREN LIPID MICROSPHERE
INTRAVENOUS | Status: AC
  Filled 2016-10-29: qty 10

## 2016-10-29 NOTE — Progress Notes (Addendum)
Patient ID: Gerald Baker, male   DOB: [birth year], 65 y.o.   MRN: [id number]     Primary Care Physician: Lillia MountainGRIFFIN,JOHN JOSEPH, MD [facility] Cardiologist: Dr. Ala DachSmith   Gerald Baker is a 65 y.o. male with a h/o PAF, seen in afib clinic initially in fall of 2016, with 3 episodes since 2014. Last episode was in 2015 when he had afib x 10 days. He spontaneously converted to SR. He developed afib this time after a day of exertion and having 3 alcoholic drinks that pm. He has been in afib now x 5-6 days. He has not missed any doses of xarelto. He is on daily doses of metoprolol 100 mg bid and will take an extra 1/2 tab of metoprolol as needed. He does notice increased fatigue, dyspnea when he is in afib. Discussed antiarrhythmics vrs cardioversion and pt does not feel his burden is significant enough to warrant antiarrythmic at this point. He does not snore, is active, is on nutrasystem and is losing weight. No significant caffeine use, but does drink 1-3 alcoholic drinks a night.  He returns today after successful cardioversion 10/27. He has been maintaining SR. Again, it was discussed if pt wanted to try AAD, different drug options discussed. He would like to see how long SR will hold and if occurs again within a short period of time from cardioversion, will more seriously consider change of medical management.  Pt returned to afib clinic, 09/25/16 with return of afib. He was seen by Dr. Johney FrameAllred and was given pill in pocket flecainide 300 mg x 1 to use for PAF. He took the flecainide twice since here last and did return to SR. He had an echo this am and was for f/u. He did have moderate LV dysfunction on echo, but EF normal at 55%, no valvular issues.Pending sleep study, reminded again today to decrease alcohol intake and weight loss encouraged.  Today, he denies symptoms of palpitations, chest pain, shortness of breath, orthopnea, PND, lower extremity edema, dizziness,  presyncope, syncope, or neurologic sequela. The patient is tolerating medications without difficulties and is otherwise without complaint today.   Past Medical History:  Diagnosis Date  . Diabetes mellitus without complication (HCC)   . Persistent atrial fibrillation (HCC)    chads2vasc is 2.  on xarelto   Past Surgical History:  Procedure Laterality Date  . CARDIOVERSION N/A 10/19/2015   Procedure: CARDIOVERSION;  Surgeon: Gerald Siiffany Evarts, MD;  [facility];  Service: Cardiovascular;  Laterality: N/A;  . KNEE ARTHROSCOPY      Current Outpatient Prescriptions  Medication Sig Dispense Refill  . metFORMIN (GLUCOPHAGE) 500 MG tablet Take 1,000 mg by mouth 2 (two) times daily with a meal.     . metoprolol (LOPRESSOR) 100 MG tablet Take 1 tablet (100 mg total) by mouth 2 (two) times daily.    [name] tablet TAKE 1 TABLET (20 MG TOTAL) BY MOUTH DAILY WITH SUPPER. 90 tablet 3  . flecainide (TAMBOCOR) 150 MG tablet Take 2 tablets (300mg ) by mouth once may repeat in 36 hours for afib. (Patient not taking: Reported on 10/29/2016) 12 tablet 0   No current facility-administered medications for this encounter.     Allergen  . Altace [Ramipril]     . Cozaar [Losartan Potassium]     . Penicillins     . Sulfur       Social History   Social History  . Marital status: Married  Spouse name: N/A  . Number of children: N/A  . Years of education: N/A   Occupational History  . Not on file.   Social History Main Topics  . Smoking status: Former Games developermoker  . Smokeless tobacco: Never Used     Comment: Quit 1997  . Alcohol use 0.0 oz/week     Comment: daily  . Drug use: No  . Sexual activity: Not on file   Other Topics Concern  . Not on file   Social History Narrative  . No narrative on file    Family History  Problem Relation Age of Onset  . Heart disease Father   . Heart failure Father     ROS- All systems are reviewed and negative except as per the  HPI above  Physical Exam: Vitals:   10/29/16 1018  BP: (!) 152/92  Pulse: 67  Weight: 283 lb 12.8 oz (128.7 kg)  Height: 6' (1.829 m)    GEN- The patient is well appearing, alert and oriented x 3 today.   Head- normocephalic, atraumatic Eyes-  Sclera clear, conjunctiva pink Ears- hearing intact Oropharynx- clear Neck- supple, no JVP Lymph- no cervical lymphadenopathy Lungs- Clear to ausculation bilaterally, normal work of breathing Heart-Regular rate and rhythm, no murmurs, rubs or gallops, PMI not laterally displaced GI- soft, NT, ND, + BS Extremities- no clubbing, cyanosis, or edema MS- no significant deformity or atrophy Skin- no rash or lesion Psych- euthymic mood, full affect Neuro- strength and sensation are intact  EKG-Sinus rhythm 67 bpm, pr int 156 ms, qrs int 72 ms, qtc 395 ms  Echo- 10/29/16- Study Conclusions  - Left ventricle: Global LV longitudinal strain is -15.8% The   cavity size was normal. There was moderate concentric   hypertrophy. Systolic function was normal. The estimated ejection   fraction was 55%. Wall motion was normal; there were no regional   wall motion abnormalities. There was an increased relative   contribution of atrial contraction to ventricular filling.   Doppler parameters are consistent with abnormal left ventricular   relaxation (grade 1 diastolic dysfunction).  Assessment and Plan: 1. Paroxysmal symptomatic afib Successful conversion to SR with flecainide pill in pocket. Advised not to take 300 mg dose more than once every 4 days. If afib becomes more frequent, may need daily. I will review with Dr. Johney FrameAllred if flecainide is still OK to use with moderate ventricular hypertrophy by echo today. Continue xarelto without missed doses. Continue metoprolol Sleep study pending  Limit alcohol Encouraged to get regular exercise/obtain weight loss  F/u with Dr. Katrinka BlazingSmith  as scheduled  afib clinic as needed   Gerald Baker, ANP-C Afib  [facility]inic [facility] [address] [place], [place] [address] [phone]  11/9- Pt called and I let him know that Dr. Johney FrameAllred reviewed echo and flecainide "PIP" is ok to use with current LVH.

## 2016-10-29 NOTE — Progress Notes (Signed)
  Echocardiogram 2D Echocardiogram with Definity has been performed.  Nolon RodBrown, Tony 10/29/2016, 9:55 AM

## 2016-11-03 ENCOUNTER — Ambulatory Visit (HOSPITAL_BASED_OUTPATIENT_CLINIC_OR_DEPARTMENT_OTHER): Payer: BLUE CROSS/BLUE SHIELD | Attending: Interventional Cardiology | Admitting: Cardiology

## 2016-11-03 VITALS — Ht 72.0 in | Wt 280.0 lb

## 2016-11-03 DIAGNOSIS — G4736 Sleep related hypoventilation in conditions classified elsewhere: Secondary | ICD-10-CM | POA: Insufficient documentation

## 2016-11-03 DIAGNOSIS — G4733 Obstructive sleep apnea (adult) (pediatric): Secondary | ICD-10-CM | POA: Diagnosis not present

## 2016-11-03 DIAGNOSIS — I480 Paroxysmal atrial fibrillation: Secondary | ICD-10-CM | POA: Insufficient documentation

## 2016-11-10 NOTE — Procedures (Signed)
   Patient Name: Gerald Baker, Alex Study Date: 11/03/2016 Gender: Male D.O.B: 07/08/1951 Age (years): 65 Referring Provider: Verdis PrimeHenry Smith Height (inches): 70 Interpreting Physician: Armanda Magicraci Zamari Bonsall MD, ABSM Weight (lbs): 280 RPSGT: Armen PickupFord, Evelyn BMI: 40 MRN: [id number] Neck Size: 17.00 CLINICAL INFORMATION Sleep Study Type: NPSG Indication for sleep study: N/A Epworth Sleepiness Score: 4  SLEEP STUDY TECHNIQUE As per the AASM Manual for the Scoring of Sleep and Associated Events v2.3 (April 2016) with a hypopnea requiring 4% desaturations. The channels recorded and monitored were frontal, central and occipital EEG, electrooculogram (EOG), submentalis EMG (chin), nasal and oral airflow, thoracic and abdominal wall motion, anterior tibialis EMG, snore microphone, electrocardiogram, and pulse oximetry.  MEDICATIONS Medications self-administered by patient taken the night of the study : N/A  SLEEP ARCHITECTURE The study was initiated at 10:54:06 PM and ended at 5:00:37 AM. Sleep onset time was 17.0 minutes and the sleep efficiency was 83.5%. The total sleep time was 306.0 minutes. Stage REM latency was 233.5 minutes. The patient spent 5.39% of the night in stage N1 sleep, 80.88% in stage N2 sleep, 2.94% in stage N3 and 10.78% in REM. Alpha intrusion was absent. Supine sleep was 28.43%.  RESPIRATORY PARAMETERS The overall apnea/hypopnea index (AHI) was 8.0 per hour. There were 0 total apneas, including 0 obstructive, 0 central and 0 mixed apneas. There were 41 hypopneas and 3 RERAs. The AHI during Stage REM sleep was 32.7 per hour. AHI while supine was 15.2 per hour. The mean oxygen saturation was 91.09%. The minimum SpO2 during sleep was 81.00%. Total time spent with oxygen saturations < 88% was 19 minutes. Moderate snoring was noted during this study.  CARDIAC DATA The 2 lead EKG demonstrated sinus rhythm. The mean heart rate was 61.05 beats per minute. Other EKG findings include:  PVCs.  LEG MOVEMENT DATA The total PLMS were 723 with a resulting PLMS index of 141.76. Associated arousal with leg movement index was 11.4 .  IMPRESSIONS - Mild obstructive sleep apnea occurred during this study (AHI = 8.0/h). - No significant central sleep apnea occurred during this study (CAI = 0.0/h). - Moderate oxygen desaturation was noted during this study (Min O2 = 81.00%). Total time spent with oxygen saturations < 88% was 19 minutes. - The patient snored with Moderate snoring volume. - EKG findings include PVCs. - Severe periodic limb movements of sleep occurred during the study. Associated arousals were significant.  DIAGNOSIS - Obstructive Sleep Apnea (327.23 [G47.33 ICD-10]) - Nocturnal Hypoxemia (327.26 [G47.36 ICD-10])  RECOMMENDATIONS - Despite mild OSA, the patient has significant nocturnal hypoxemia and therefore recommend therapeutic CPAP titration to determine optimal pressure required to alleviate sleep disordered breathing. - Avoid alcohol, sedatives and other CNS depressants that may worsen sleep apnea and disrupt normal sleep architecture. - Sleep hygiene should be reviewed to assess factors that may improve sleep quality. - Weight management and regular exercise should be initiated or continued if appropriate.  Armanda Magicraci Kanyon Seibold Diplomate, American Board of Sleep Medicine  ELECTRONICALLY SIGNED ON:  11/10/2016, 5:20 PM [facility] PH: [phone]   FX: [phone] ACCREDITED BY THE AMERICAN ACADEMY OF SLEEP MEDICINE

## 2016-11-11 ENCOUNTER — Telehealth: Payer: Self-pay | Admitting: *Deleted

## 2016-11-11 NOTE — Telephone Encounter (Signed)
-----   Message from Quintella Reichertraci R Turner, MD sent at 11/10/2016  5:21 PM EST ----- Please let patient know that they have sleep apnea and recommend CPAP titration. Please set up titration in the sleep lab.

## 2016-11-11 NOTE — Telephone Encounter (Signed)
Spoke to the patient concerning his results of mild sleep apnea and Dr. Malachy Moodurners recommendation of CPAP titration. The patient does not want to do the test. He states he feels like he gets enough sleep 8-10 hours at night and is not sleepy during the day. He says he will follow up with Dr. Katrinka BlazingSmith his cardiologist.

## 2016-11-11 NOTE — Progress Notes (Signed)
Spoke to patient about his results, he verbalized he felt like he sleeps well at night 8-10 hours and did not want to do another study.

## 2016-12-04 ENCOUNTER — Other Ambulatory Visit: Payer: Self-pay | Admitting: Interventional Cardiology

## 2016-12-17 ENCOUNTER — Encounter (HOSPITAL_COMMUNITY): Payer: Self-pay | Admitting: Nurse Practitioner

## 2016-12-19 ENCOUNTER — Telehealth: Payer: Self-pay | Admitting: *Deleted

## 2016-12-19 NOTE — Telephone Encounter (Signed)
I have not received anything as of yet concerning your sleep study. When DR Mayford Knifeurner reads your study and sends me the results I will be in touch with you. Thanks, Coralee NorthNina

## 2017-01-29 ENCOUNTER — Encounter (HOSPITAL_COMMUNITY): Payer: Self-pay | Admitting: Nurse Practitioner

## 2017-02-03 ENCOUNTER — Telehealth: Payer: Self-pay | Admitting: *Deleted

## 2017-02-03 NOTE — Telephone Encounter (Signed)
Called the patient and left a message to return my call

## 2017-02-03 NOTE — Telephone Encounter (Signed)
Called the patient and tried to explain his sleep study again but he said he wants to see all his paperwork, his sleep study and his labs in his my chart.

## 2017-02-06 ENCOUNTER — Telehealth: Payer: Self-pay | Admitting: *Deleted

## 2017-02-06 NOTE — Telephone Encounter (Signed)
-----   Message from Shona SimpsonStacy S Carter, RN sent at 02/06/2017  3:34 PM EST ----- Regarding: RE: following up on previously sent message This is the mychart message I responded with to the patient.....Marland Kitchen.I'm glad Dr. Norris Crossurner's assistant returned your call. Not all portions of a patient's chart are uploaded to mychart so I'm not sure if all portions are ever fully available in mychart regarding sleep studies. I have attached the number to mychart support as they may be able to help explain which portions of a chart are uploaded to mychart. [phone]. You also have the option to contact medical records to [facility] a [facility]. If you would prefer to actually meet with Dr. Mayford Knifeurner to further discuss your sleep study results that can be an option as well.  ----- Message ----- From: Reesa Cheworothea G Lexie Koehl, CMA Sent: 02/06/2017   2:45 PM To: Shona SimpsonStacy S Carter, RN Subject: RE: following up on previously sent message    Called the patient and tried to explain his sleep study again but he said he wants to see all his paperwork, his sleep study and his labs in his my chart. Thanks ----- Message ----- From: Shona SimpsonStacy S Carter, RN Sent: 02/03/2017  10:51 AM To: Reesa Cheworothea G Coran Dipaola, CMA Subject: following up on previously sent message        Hi Coralee Northina,  I wanted to make sure you received the message I forwarded to you last week in regards to Mr. Christell ConstantMoore. I also sent this message to Dr. Mayford Knifeurner and want to make sure the patient is contacted in regards to his sleep study concerns. Thanks Texas InstrumentsStacy

## 2017-02-06 NOTE — Telephone Encounter (Signed)
Late Entry...Marland Kitchen.Marland Kitchen.I also told the patient that he could get a copy of his sleep study through medical records after he signs a release form. He seemed ok with that.

## 2017-05-03 ENCOUNTER — Emergency Department (HOSPITAL_COMMUNITY): Payer: BLUE CROSS/BLUE SHIELD

## 2017-05-03 ENCOUNTER — Observation Stay (HOSPITAL_COMMUNITY): Payer: BLUE CROSS/BLUE SHIELD

## 2017-05-03 ENCOUNTER — Observation Stay (HOSPITAL_COMMUNITY)
Admission: EM | Admit: 2017-05-03 | Discharge: 2017-05-05 | Disposition: A | Discharge status: Home / Self Care | Payer: BLUE CROSS/BLUE SHIELD | Attending: Internal Medicine | Admitting: Internal Medicine

## 2017-05-03 ENCOUNTER — Encounter (HOSPITAL_COMMUNITY): Payer: Self-pay | Admitting: Radiology

## 2017-05-03 DIAGNOSIS — Z87891 Personal history of nicotine dependence: Secondary | ICD-10-CM | POA: Insufficient documentation

## 2017-05-03 DIAGNOSIS — E669 Obesity, unspecified: Secondary | ICD-10-CM | POA: Insufficient documentation

## 2017-05-03 DIAGNOSIS — Z7901 Long term (current) use of anticoagulants: Secondary | ICD-10-CM | POA: Diagnosis not present

## 2017-05-03 DIAGNOSIS — I480 Paroxysmal atrial fibrillation: Secondary | ICD-10-CM

## 2017-05-03 DIAGNOSIS — I482 Chronic atrial fibrillation: Secondary | ICD-10-CM | POA: Insufficient documentation

## 2017-05-03 DIAGNOSIS — E118 Type 2 diabetes mellitus with unspecified complications: Secondary | ICD-10-CM

## 2017-05-03 DIAGNOSIS — I110 Hypertensive heart disease with heart failure: Secondary | ICD-10-CM | POA: Insufficient documentation

## 2017-05-03 DIAGNOSIS — I10 Essential (primary) hypertension: Secondary | ICD-10-CM | POA: Diagnosis not present

## 2017-05-03 DIAGNOSIS — R0989 Other specified symptoms and signs involving the circulatory and respiratory systems: Secondary | ICD-10-CM | POA: Diagnosis present

## 2017-05-03 DIAGNOSIS — Z882 Allergy status to sulfonamides status: Secondary | ICD-10-CM | POA: Diagnosis not present

## 2017-05-03 DIAGNOSIS — E785 Hyperlipidemia, unspecified: Secondary | ICD-10-CM | POA: Insufficient documentation

## 2017-05-03 DIAGNOSIS — Z7982 Long term (current) use of aspirin: Secondary | ICD-10-CM | POA: Diagnosis not present

## 2017-05-03 DIAGNOSIS — E119 Type 2 diabetes mellitus without complications: Secondary | ICD-10-CM

## 2017-05-03 DIAGNOSIS — I5032 Chronic diastolic (congestive) heart failure: Secondary | ICD-10-CM | POA: Insufficient documentation

## 2017-05-03 DIAGNOSIS — Z7984 Long term (current) use of oral hypoglycemic drugs: Secondary | ICD-10-CM | POA: Insufficient documentation

## 2017-05-03 DIAGNOSIS — Z6837 Body mass index (BMI) 37.0-37.9, adult: Secondary | ICD-10-CM | POA: Insufficient documentation

## 2017-05-03 DIAGNOSIS — I639 Cerebral infarction, unspecified: Secondary | ICD-10-CM

## 2017-05-03 DIAGNOSIS — R4701 Aphasia: Secondary | ICD-10-CM | POA: Diagnosis not present

## 2017-05-03 DIAGNOSIS — I481 Persistent atrial fibrillation: Secondary | ICD-10-CM | POA: Diagnosis not present

## 2017-05-03 DIAGNOSIS — Z880 Allergy status to penicillin: Secondary | ICD-10-CM | POA: Diagnosis not present

## 2017-05-03 DIAGNOSIS — E1136 Type 2 diabetes mellitus with diabetic cataract: Secondary | ICD-10-CM | POA: Insufficient documentation

## 2017-05-03 DIAGNOSIS — I7774 Dissection of vertebral artery: Secondary | ICD-10-CM | POA: Insufficient documentation

## 2017-05-03 HISTORY — DX: Essential (primary) hypertension: I10

## 2017-05-03 LAB — URINALYSIS, ROUTINE W REFLEX MICROSCOPIC
Bilirubin Urine: NEGATIVE
GLUCOSE, UA: NEGATIVE mg/dL
HGB URINE DIPSTICK: NEGATIVE
Ketones, ur: 5 mg/dL — AB
LEUKOCYTES UA: NEGATIVE
Nitrite: NEGATIVE
PROTEIN: NEGATIVE mg/dL
Specific Gravity, Urine: 1.026 (ref 1.005–1.030)
pH: 5 (ref 5.0–8.0)

## 2017-05-03 LAB — COMPREHENSIVE METABOLIC PANEL
ALBUMIN: 3.6 g/dL (ref 3.5–5.0)
ALT: 30 U/L (ref 17–63)
AST: 27 U/L (ref 15–41)
Alkaline Phosphatase: 55 U/L (ref 38–126)
Anion gap: 9 (ref 5–15)
BILIRUBIN TOTAL: 0.5 mg/dL (ref 0.3–1.2)
BUN: 13 mg/dL (ref 6–20)
CO2: 25 mmol/L (ref 22–32)
CREATININE: 0.86 mg/dL (ref 0.61–1.24)
Calcium: 8.9 mg/dL (ref 8.9–10.3)
Chloride: 101 mmol/L (ref 101–111)
GFR calc Af Amer: >60 mL/min (ref >60)
GFR calc non Af Amer: >60 mL/min (ref >60)
GLUCOSE: 139 mg/dL — AB (ref 65–99)
POTASSIUM: 4.1 mmol/L (ref 3.5–5.1)
Sodium: 135 mmol/L (ref 135–145)
TOTAL PROTEIN: 6.7 g/dL (ref 6.5–8.1)

## 2017-05-03 LAB — DIFFERENTIAL
BASOS ABS: 0 10*3/uL (ref 0.0–0.1)
Basophils Relative: 0 %
EOS ABS: 0.2 10*3/uL (ref 0.0–0.7)
Eosinophils Relative: 3 %
Lymphocytes Relative: 31 %
Lymphs Abs: 2.9 10*3/uL (ref 0.7–4.0)
Monocytes Absolute: 0.6 10*3/uL (ref 0.1–1.0)
Monocytes Relative: 7 %
Neutro Abs: 5.6 10*3/uL (ref 1.7–7.7)
Neutrophils Relative %: 59 %

## 2017-05-03 LAB — RAPID URINE DRUG SCREEN, HOSP PERFORMED
Amphetamines: NOT DETECTED
BARBITURATES: NOT DETECTED
BENZODIAZEPINES: NOT DETECTED
COCAINE: NOT DETECTED
Opiates: NOT DETECTED
TETRAHYDROCANNABINOL: NOT DETECTED

## 2017-05-03 LAB — I-STAT CHEM 8, ED
BUN: 14 mg/dL (ref 6–20)
CREATININE: 0.8 mg/dL (ref 0.61–1.24)
Calcium, Ion: 1.09 mmol/L — ABNORMAL LOW (ref 1.15–1.40)
Chloride: 101 mmol/L (ref 101–111)
Glucose, Bld: 140 mg/dL — ABNORMAL HIGH (ref 65–99)
HEMATOCRIT: 45 % (ref 39.0–52.0)
HEMOGLOBIN: 15.3 g/dL (ref 13.0–17.0)
POTASSIUM: 4.1 mmol/L (ref 3.5–5.1)
Sodium: 137 mmol/L (ref 135–145)
TCO2: 26 mmol/L (ref 0–100)

## 2017-05-03 LAB — CBC
HCT: 42.7 % (ref 39.0–52.0)
HEMOGLOBIN: 14.4 g/dL (ref 13.0–17.0)
MCH: 30.3 pg (ref 26.0–34.0)
MCHC: 33.7 g/dL (ref 30.0–36.0)
MCV: 89.7 fL (ref 78.0–100.0)
PLATELETS: 234 10*3/uL (ref 150–400)
RBC: 4.76 MIL/uL (ref 4.22–5.81)
RDW: 13.6 % (ref 11.5–15.5)
WBC: 9.4 10*3/uL (ref 4.0–10.5)

## 2017-05-03 LAB — I-STAT TROPONIN, ED: Troponin i, poc: 0 ng/mL (ref 0.00–0.08)

## 2017-05-03 LAB — ETHANOL: Alcohol, Ethyl (B): <5 mg/dL (ref <5)

## 2017-05-03 LAB — PROTIME-INR
INR: 1.11
Prothrombin Time: 14.3 seconds (ref 11.4–15.2)

## 2017-05-03 LAB — APTT: APTT: 30 s (ref 24–36)

## 2017-05-03 LAB — CBG MONITORING, ED: Glucose-Capillary: 134 mg/dL — ABNORMAL HIGH (ref 65–99)

## 2017-05-03 MED ORDER — SODIUM CHLORIDE 0.9 % IV SOLN: 250.0000 mL | INTRAVENOUS | Status: DC | PRN

## 2017-05-03 MED ORDER — SODIUM CHLORIDE 0.9% FLUSH: 3.0000 mL | INTRAVENOUS | Status: DC | PRN

## 2017-05-03 MED ORDER — ACETAMINOPHEN 325 MG PO TABS: 650.0000 mg | ORAL_TABLET | ORAL | Status: DC | PRN

## 2017-05-03 MED ORDER — IOPAMIDOL (ISOVUE-370) INJECTION 76%
INTRAVENOUS | Status: AC
  Administered 2017-05-03: 50 mL
  Filled 2017-05-03: qty 50

## 2017-05-03 MED ORDER — STROKE: EARLY STAGES OF RECOVERY BOOK
Freq: Once | Status: AC
  Administered 2017-05-04: 05:00:00
  Filled 2017-05-03: qty 1

## 2017-05-03 MED ORDER — RIVAROXABAN 20 MG PO TABS
20.0000 mg | ORAL_TABLET | Freq: Every day | ORAL | Status: DC
  Administered 2017-05-04 (×2): 20 mg via ORAL
  Filled 2017-05-03 (×2): qty 1

## 2017-05-03 MED ORDER — SENNOSIDES-DOCUSATE SODIUM 8.6-50 MG PO TABS: 1.0000 | ORAL_TABLET | Freq: Every evening | ORAL | Status: DC | PRN

## 2017-05-03 MED ORDER — ACETAMINOPHEN 160 MG/5ML PO SOLN: 650.0000 mg | ORAL | Status: DC | PRN

## 2017-05-03 MED ORDER — INSULIN ASPART 100 UNIT/ML ~~LOC~~ SOLN: 0.0000 [IU] | Freq: Every day | SUBCUTANEOUS | Status: DC

## 2017-05-03 MED ORDER — METOPROLOL TARTRATE 5 MG/5ML IV SOLN: 2.5000 mg | INTRAVENOUS | Status: DC | PRN

## 2017-05-03 MED ORDER — ACETAMINOPHEN 650 MG RE SUPP: 650.0000 mg | RECTAL | Status: DC | PRN

## 2017-05-03 MED ORDER — IOPAMIDOL (ISOVUE-370) INJECTION 76%
INTRAVENOUS | Status: AC
  Filled 2017-05-03: qty 50

## 2017-05-03 MED ORDER — LABETALOL HCL 5 MG/ML IV SOLN: 5.0000 mg | INTRAVENOUS | Status: DC | PRN

## 2017-05-03 MED ORDER — ASPIRIN 300 MG RE SUPP: 300.0000 mg | Freq: Every day | RECTAL | Status: DC

## 2017-05-03 MED ORDER — ASPIRIN 325 MG PO TABS
325.0000 mg | ORAL_TABLET | Freq: Every day | ORAL | Status: DC
  Administered 2017-05-04 – 2017-05-05 (×2): 325 mg via ORAL
  Filled 2017-05-03 (×2): qty 1

## 2017-05-03 MED ORDER — INSULIN ASPART 100 UNIT/ML ~~LOC~~ SOLN
0.0000 [IU] | Freq: Three times a day (TID) | SUBCUTANEOUS | Status: DC
  Administered 2017-05-04 (×2): 2 [IU] via SUBCUTANEOUS
  Administered 2017-05-05: 1 [IU] via SUBCUTANEOUS

## 2017-05-03 MED ORDER — SODIUM CHLORIDE 0.9% FLUSH
3.0000 mL | Freq: Two times a day (BID) | INTRAVENOUS | Status: DC
  Administered 2017-05-04 (×3): 3 mL via INTRAVENOUS

## 2017-05-03 NOTE — ED Provider Notes (Signed)
[facility]-EMERGENCY DEPT Provider Note   CSN: [id number] Arrival date & time: 05/03/17  1856   An emergency department physician performed an initial assessment on this suspected stroke patient at 86.  History   Chief Complaint Chief Complaint  Patient presents with  . Altered Mental Status    HPI KYREL LEIGHTON is a 66 y.o. male.  Patient is a 66 year old male with a history of hypertension, diabetes, dyslipidemia, cataracts and small atrial fibrillation on Xarelto, and obesity presenting today with difficulty with speech. Patient states for the last 2 days he hasn't felt himself but cannot pinpoint what was wrong however at 57 today wife states that he suddenly started to seem confused and could not get his words out right. They have been around him all day and he was normal throughout the day speaking but just didn't seem quite himself and that he did not have an appetite. He just kept saying he didn't feel great. He is denying fever, nausea, vomiting, chest pain, shortness of breath, abdominal pain, leg pain, weakness, numbness, vision changes. He denies any recent medication changes. He has not taken his blood thinner today and usually takes it at dinnertime.  No prior symptoms of similar events   The history is provided by the patient and the spouse.    Past Medical History:  Diagnosis Date  . Diabetes mellitus without complication (HCC)   . Persistent atrial fibrillation (HCC)    chads2vasc is 2.  on xarelto    Patient Active Problem List   Diagnosis Date Noted  . Chronic anticoagulation 05/10/2014  . Chronic diastolic heart failure (HCC) 05/10/2014  . HTN (hypertension) 01/20/2014  . Diabetes mellitus (HCC) 01/20/2014  . Dyslipidemia 01/20/2014  . Obesity 01/20/2014  . Paroxysmal atrial fibrillation (HCC) 01/20/2014  . Diverticulosis 01/20/2014  . 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 Heme positive stool 01/20/2014  . Varicose vein of leg 01/20/2014    Past Surgical  History:  Procedure Laterality Date  . CARDIOVERSION N/A 10/19/2015   Procedure: CARDIOVERSION;  Surgeon: Chilton Si, MD;  Location: [facility] ENDOSCOPY;  Service: Cardiovascular;  Laterality: N/A;  . KNEE ARTHROSCOPY         Home Medications    Prior to Admission medications   Medication Sig Start Date End Date Taking? Authorizing Provider  flecainide (TAMBOCOR) 150 MG tablet Take 2 tablets (300mg ) by mouth once may repeat in 36 hours for afib. Patient not taking: Reported on 10/29/2016 09/25/16   Allred, Fayrene Fearing, MD  metFORMIN (GLUCOPHAGE) 500 MG tablet Take 1,000 mg by mouth 2 (two) times daily with a meal.     [provider]  metoprolol (LOPRESSOR) 100 MG tablet Take 1 tablet (100 mg total) by mouth 2 (two) times daily. 11/25/14   Lyn Records, MD  XARELTO 20 MG TABS tablet TAKE 1 TABLET (20 MG TOTAL) BY MOUTH DAILY WITH SUPPER. 12/05/16   Lyn Records, MD    Family History Family History  Problem Relation Age of Onset  . Heart disease Father   . Heart failure Father     Social History Social History  Substance Use Topics  . Smoking status: Former Games developer  . Smokeless tobacco: Never Used     Comment: Quit 1997  . Alcohol use 0.0 oz/week     Comment: daily     Allergies   Altace [ramipril]; Cozaar [losartan potassium]; Penicillins; and Sulfur   Review of Systems Review of Systems  All other systems reviewed and are negative.  Physical Exam Updated Vital Signs BP (!) 173/94   Pulse 85   Temp 99.5 F (37.5 C) (Rectal)   Resp 19   SpO2 98%   Physical Exam  Constitutional: He is oriented to person, place, and time. He appears well-developed and well-nourished. No distress.  HENT:  Head: Normocephalic and atraumatic.  Mouth/Throat: Oropharynx is clear and moist.  Eyes: Conjunctivae and EOM are normal. Pupils are equal, round, and reactive to light.  Neck: Normal range of motion. Neck supple.  Cardiovascular: Normal rate, regular rhythm and  intact distal pulses.   No murmur heard. Pulmonary/Chest: Effort normal and breath sounds normal. No respiratory distress. He has no wheezes. He has no rales.  Abdominal: Soft. He exhibits no distension. There is no tenderness. There is no rebound and no guarding.  Musculoskeletal: Normal range of motion. He exhibits no edema or tenderness.  Neurological: He is alert and oriented to person, place, and time. He has normal strength. No cranial nerve deficit or sensory deficit. Coordination normal.  Expressive aphasia initially which was mild and on reexamination became more severe. No pronator drift, visual field cuts  Skin: Skin is warm and dry. No rash noted. No erythema.  Psychiatric: He has a normal mood and affect. His behavior is normal.  Nursing note and vitals reviewed.    ED Treatments / Results  Labs (all labs ordered are listed, but only abnormal results are displayed) Labs Reviewed  COMPREHENSIVE METABOLIC PANEL - Abnormal; Notable for the following:       Result Value   Glucose, Bld 139 (*)    All other components within normal limits  CBG MONITORING, ED - Abnormal; Notable for the following:    Glucose-Capillary 134 (*)    All other components within normal limits  I-STAT CHEM 8, ED - Abnormal; Notable for the following:    Glucose, Bld 140 (*)    Calcium, Ion 1.09 (*)    All other components within normal limits  ETHANOL  PROTIME-INR  APTT  CBC  DIFFERENTIAL  RAPID URINE DRUG SCREEN, HOSP PERFORMED  URINALYSIS, ROUTINE W REFLEX MICROSCOPIC  I-STAT TROPOININ, ED    EKG  EKG Interpretation  Date/Time:  Saturday May 03 2017 19:06:40 EDT Ventricular Rate:  86 PR Interval:    QRS Duration: 81 QT Interval:  356 QTC Calculation: 426 R Axis:   52 Text Interpretation:  Sinus rhythm Abnormal R-wave progression, early transition No significant change since last tracing Confirmed by Anitra Lauth  MD, Alphonzo Lemmings ([id number]) on 05/03/2017 7:38:15 PM       Radiology Ct Angio  Head W Or Wo Contrast  Result Date: 05/03/2017 CLINICAL DATA:  Stroke and aphasia. EXAM: CT ANGIOGRAPHY HEAD AND NECK TECHNIQUE: Multidetector CT imaging of the head and neck was performed using the standard protocol during bolus administration of intravenous contrast. Multiplanar CT image reconstructions and MIPs were obtained to evaluate the vascular anatomy. Carotid stenosis measurements (when applicable) are obtained utilizing NASCET criteria, using the distal internal carotid diameter as the denominator. CONTRAST:  50 cc Isovue 370 intravenous COMPARISON:  Noncontrast head CT from earlier today FINDINGS: CTA NECK FINDINGS Aortic arch: 3 vessel branching.  No abnormal finding Right carotid system: Small volume atherosclerotic plaque at the carotid bulb. No stenosis, ulceration, or dissection. ICA tortuosity before the skullbase. Left carotid system: Mild atherosclerotic plaque at the ICA bulb. No stenosis, ulceration, or dissection. ICA tortuosity before the skullbase. Vertebral arteries: No proximal subclavian stenosis. Right vertebral dominance, smooth and widely patent to  the dura. Subclavian and vertebral origin plaque with moderate stenosis. There is irregular narrowing of the left distal V2 and V3 segments with faint flow via the level of the dura. The distal V4 segment sizable. Skeleton: No acute or aggressive finding Other neck: No evidence of inflammation. Prominent lingual tonsil, partially effacing the vallecula. Upper chest: Airway thickening at the apices. Review of the MIP images confirms the above findings CTA HEAD FINDINGS Anterior circulation: Smooth and widely patent carotid siphons. No major branch occlusion, beading, or stenosis. Negative for aneurysm. Posterior circulation: Asymmetric poor flow in the left vertebral artery with severe proximal V4 segment stenosis. Left PICA is patent. The basilar is smooth and widely patent, as are the bilateral PCA. Venous sinuses: Patent Anatomic  variants: None significant Delayed phase: No abnormal intracranial enhancement. These results were called by telephone at the time of interpretation on 05/03/2017 at 9:36 pm to Dr. Ritta SlotMCNEILL KIRKPATRICK , who verbally acknowledged these results. Review of the MIP images confirms the above findings IMPRESSION: 1. 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 There is severe stenosis just beyond the dura, flow to the left PICA is likely retrograde. 2. Very mild atheromatous changes in the cervical carotids. No stenosis or branch occlusion in the anterior circulation. Electronically Signed   By: Marnee SpringJonathon  Watts M.D.   On: 05/03/2017 21:36   Ct Angio Neck W Or Wo Contrast  Result Date: 05/03/2017 CLINICAL DATA:  Stroke and aphasia. EXAM: CT ANGIOGRAPHY HEAD AND NECK TECHNIQUE: Multidetector CT imaging of the head and neck was performed using the standard protocol during bolus administration of intravenous contrast. Multiplanar CT image reconstructions and MIPs were obtained to evaluate the vascular anatomy. Carotid stenosis measurements (when applicable) are obtained utilizing NASCET criteria, using the distal internal carotid diameter as the denominator. CONTRAST:  50 cc Isovue 370 intravenous COMPARISON:  Noncontrast head CT from earlier today FINDINGS: CTA NECK FINDINGS Aortic arch: 3 vessel branching.  No abnormal finding Right carotid system: Small volume atherosclerotic plaque at the carotid bulb. No stenosis, ulceration, or dissection. ICA tortuosity before the skullbase. Left carotid system: Mild atherosclerotic plaque at the ICA bulb. No stenosis, ulceration, or dissection. ICA tortuosity before the skullbase. Vertebral arteries: No proximal subclavian stenosis. Right vertebral dominance, smooth and widely patent to the dura. Subclavian and vertebral origin plaque with moderate stenosis. There is irregular narrowing of the left distal V2 and V3 segments with faint  flow via the level of the dura. The distal V4 segment sizable. Skeleton: No acute or aggressive finding Other neck: No evidence of inflammation. Prominent lingual tonsil, partially effacing the vallecula. Upper chest: Airway thickening at the apices. Review of the MIP images confirms the above findings CTA HEAD FINDINGS Anterior circulation: Smooth and widely patent carotid siphons. No major branch occlusion, beading, or stenosis. Negative for aneurysm. Posterior circulation: Asymmetric poor flow in the left vertebral artery with severe proximal V4 segment stenosis. Left PICA is patent. The basilar is smooth and widely patent, as are the bilateral PCA. Venous sinuses: Patent Anatomic variants: None significant Delayed phase: No abnormal intracranial enhancement. These results were called by telephone at the time of interpretation on 05/03/2017 at 9:36 pm to Dr. Ritta SlotMCNEILL KIRKPATRICK , who verbally acknowledged these results. Review of the MIP images confirms the above findings IMPRESSION: 1. 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 There is severe stenosis just beyond the dura, flow to the left PICA is likely  retrograde. 2. Very mild atheromatous changes in the cervical carotids. No stenosis or branch occlusion in the anterior circulation. Electronically Signed   By: Marnee Spring M.D.   On: 05/03/2017 21:36   Dg Chest Port 1 View  Result Date: 05/03/2017 CLINICAL DATA:  Acute fatigue and weakness. EXAM: PORTABLE CHEST 1 VIEW COMPARISON:  08/03/2009 and prior chest radiographs FINDINGS: The cardiomediastinal silhouette is unremarkable. There is no evidence of focal airspace disease, pulmonary edema, suspicious pulmonary nodule/mass, pleural effusion, or pneumothorax. No acute bony abnormalities are identified. IMPRESSION: No active disease. Electronically Signed   By: Harmon Pier M.D.   On: 05/03/2017 20:12   Ct Head Code Stroke W/o Cm  Result Date:  05/03/2017 CLINICAL DATA:  Code stroke.  Aphasia.  Sudden onset confusion. EXAM: CT HEAD WITHOUT CONTRAST TECHNIQUE: Contiguous axial images were obtained from the base of the skull through the vertex without intravenous contrast. COMPARISON:  None. FINDINGS: Brain: No evidence of acute infarction, hemorrhage, hydrocephalus. Small incidental arachnoid cyst in the medial left temporal fossa measuring 20 x 10 mm. Vascular: No hyperdense vessel. Scattered atherosclerotic calcification. Skull: Negative Sinuses/Orbits: Negative Other: Text page with results sent on 05/03/2017 at 7:32 pm to Dr. Amada Jupiter ASPECTS [facility] Stroke Program Early CT Score) - Ganglionic level infarction (caudate, lentiform nuclei, internal capsule, insula, M1-M3 cortex): 7 - Supraganglionic infarction (M4-M6 cortex): 3 Total score (0-10 with 10 being normal): 10 IMPRESSION: No acute finding.  ASPECTS is 10. Electronically Signed   By: Marnee Spring M.D.   On: 05/03/2017 19:33    Procedures Procedures (including critical care time)  Medications Ordered in ED Medications - No data to display   Initial Impression / Assessment and Plan / ED Course  I have reviewed the triage vital signs and the nursing notes.  Pertinent labs & imaging results that were available during my care of the patient were reviewed by me and considered in my medical decision making (see chart for details).    Patient is a 66 year old male presenting today with symptoms concerning for stroke. He has expressive aphasia initially on exam his symptoms were mild and only would have difficulty with speech after a few sentences. However on reevaluation after CAT scan he was having more difficulty almost with every sentence. Discussed this with neurology in their sending him for a CTA. Initial CAT scan without acute findings. Labs are within normal limits. Patient does not have a fever or hypoglycemia. Last seen normal was 1745.  Because patient taking  anticoagulation he is not a candidate for TPA.  10:02 PM CTA without acute cause of stroke.  Pt will be admitted for stroke work up   Final Clinical Impressions(s) / ED Diagnoses   Final diagnoses:  Cerebrovascular accident (CVA), unspecified mechanism (HCC)    New Prescriptions New Prescriptions   No medications on file     Gwyneth Sprout, MD 05/03/17 2203

## 2017-05-03 NOTE — H&P (Signed)
History and Physical    JASKARAN DAUZAT ZOX:[id number] DOB: 22-May-1951 DOA: 05/03/2017  PCP: Kirby Funk, MD   Patient coming from: Home  Chief Complaint: T[employer] Deiters, speech difficulty   HPI: Gerald Baker is a 66 y.o. male with medical history significant for type 2 diabetes mellitus, hypertension, and paroxysmal atrial fibrillation on Xarelto, now presenting to the emergency department for evaluation of malaise, fatigue, and speech difficulty. Patient reports that he had been in his usual state of health until developing some nonspecific fatigue and malaise yesterday. He also notes a brief transient visual disturbance yesterday marked by "seeing spots." This afternoon, the patient was noted at approximately 17:30 to have difficulty with his speech, with family reporting that he had "difficulty getting the right words out." Patient denies headache, change in vision or hearing, or focal numbness or weakness. He denies experiencing similar symptoms previously. There has not been any recent fevers, chills, dyspnea, cough, abdominal pain, or dysuria. Patient reports continued adherence with Xarelto. He can feel palpitations when going into atrial fibrillation, but has not experienced this in more than a couple months. There was no recent fall or trauma and no use of alcohol or illicit substances.  ED Course: Upon arrival to the ED, patient is found to be afebrile, saturating well on room air, slightly hypertensive, and with vitals otherwise stable. EKG features a sinus rhythm with early transition and chest x-rays negative for acute cardiopulmonary disease. Noncontrast head CT is negative for acute intracranial abnormality. Chemistry panel and CBC are unremarkable, troponin is undetectable, UDS is negative, ethanol level is undetectable, and urinalysis is unremarkable. Patient seemed to have worsening in his expressive aphasia while in the emergency department and the neurology consultant advised sending him  for CTA of the head and neck. Patient later seemed to improve in the ED. He has remained hemodynamically stable and in no apparent respiratory distress and will be observed on the medical-surgical unit for ongoing evaluation and management of expressive aphasia suspected secondary to acute CVA.  Review of Systems:  All other systems reviewed and apart from HPI, are negative.  Past Medical History:  Diagnosis Date  . Diabetes mellitus without complication (HCC)   . Hypertension   . Persistent atrial fibrillation (HCC)    chads2vasc is 2.  on xarelto    Past Surgical History:  Procedure Laterality Date  . CARDIOVERSION N/A 10/19/2015   Procedure: CARDIOVERSION;  Surgeon: Chilton Si, MD;  Location: [facility] ENDOSCOPY;  Service: Cardiovascular;  Laterality: N/A;  . KNEE ARTHROSCOPY       reports that he has quit smoking. He has never used smokeless tobacco. He reports that he drinks alcohol. He reports that he does not use drugs.  Allergies  Allergen Reactions  . Altace [Ramipril] Cough  . Cozaar [Losartan Potassium] Other (See Comments)    Leg pain  . Penicillins Hives and Rash    Has patient had a PCN reaction causing immediate rash, facial/tongue/throat swelling, SOB or lightheadedness with hypotension: Yes Has patient had a PCN reaction causing severe rash involving mucus membranes or skin necrosis: No Has patient had a PCN reaction that required hospitalization No Has patient had a PCN reaction occurring within the last 10 years: No If all of the above answers are "NO", then may proceed with Cephalosporin use.   . Sulfa Antibiotics Rash    Allergy from childhood  . Sulfur Rash    Might have meant (Sulfa??)    Family History  Problem Relation Age of  Onset  . Heart disease Father   . Heart failure Father      Prior to Admission medications   Medication Sig Start Date End Date Taking? Authorizing Provider  flecainide (TAMBOCOR) 150 MG tablet Take 2 tablets (300mg ) by  mouth once may repeat in 36 hours for afib. Patient taking differently: Take 450 mg by mouth once as needed (for A-FIB). And may repeat once in 36 hours for afib (if no results) 09/25/16  Yes Allred, Fayrene Fearing, MD  metFORMIN (GLUCOPHAGE-XR) 500 MG 24 hr tablet Take 1,000 mg by mouth 2 (two) times daily. 04/25/17  Yes [provider]  metoprolol (LOPRESSOR) 100 MG tablet Take 1 tablet (100 mg total) by mouth 2 (two) times daily. 11/25/14  Yes Lyn Records, MD  XARELTO 20 MG TABS tablet TAKE 1 TABLET (20 MG TOTAL) BY MOUTH DAILY WITH SUPPER. 12/05/16  Yes Lyn Records, MD    Physical Exam: Vitals:   05/03/17 2030 05/03/17 2130 05/03/17 2200 05/03/17 2230  BP: (!) 152/89 (!) 150/92 (!) 143/98 (!) 154/91  Pulse: 88 82 84 84  Resp: 19 20 19 16   Temp:      TempSrc:      SpO2: 98% 96% 95% 97%      Constitutional: NAD, calm, comfortable Eyes: PERTLA, lids and conjunctivae normal ENMT: Mucous membranes are moist. Posterior pharynx clear of any exudate or lesions.   Neck: normal, supple, no masses, no thyromegaly Respiratory: clear to auscultation bilaterally, no wheezing, no crackles. Normal respiratory effort.  Cardiovascular: S1 & S2 heard, regular rate and rhythm. No extremity edema. No significant JVD. Abdomen: No distension, no tenderness, no masses palpated. Bowel sounds normal.  Musculoskeletal: no clubbing / cyanosis. No joint deformity upper and lower extremities.  Skin: no significant rashes, lesions, ulcers. Warm, dry, well-perfused. Neurologic: CN 2-12 grossly intact. Sensation intact, DTR normal. Strength 5/5 in all 4 limbs.  Psychiatric: Alert and oriented x 3. Pleasant and cooperative.     Labs on Admission: I have personally reviewed following labs and imaging studies  CBC:  Recent Labs Lab 05/03/17 1925 05/03/17 1936  WBC 9.4  --   NEUTROABS 5.6  --   HGB 14.4 15.3  HCT 42.7 45.0  MCV 89.7  --   PLT 234  --    Basic Metabolic Panel:  Recent Labs Lab  05/03/17 1925 05/03/17 1936  NA 135 137  K 4.1 4.1  CL 101 101  CO2 25  --   GLUCOSE 139* 140*  BUN 13 14  CREATININE 0.86 0.80  CALCIUM 8.9  --    GFR: CrCl cannot be calculated (Unknown ideal weight.). Liver Function Tests:  Recent Labs Lab 05/03/17 1925  AST 27  ALT 30  ALKPHOS 55  BILITOT 0.5  PROT 6.7  ALBUMIN 3.6   No results for input(s): LIPASE, AMYLASE in the last 168 hours. No results for input(s): AMMONIA in the last 168 hours. Coagulation Profile:  Recent Labs Lab 05/03/17 1925  INR 1.11   Cardiac Enzymes: No results for input(s): CKTOTAL, CKMB, CKMBINDEX, TROPONINI in the last 168 hours. BNP (last 3 results) No results for input(s): PROBNP in the last 8760 hours. HbA1C: No results for input(s): HGBA1C in the last 72 hours. CBG:  Recent Labs Lab 05/03/17 1911  GLUCAP 134*   Lipid Profile: No results for input(s): CHOL, HDL, LDLCALC, TRIG, CHOLHDL, LDLDIRECT in the last 72 hours. Thyroid Function Tests: No results for input(s): TSH, T4TOTAL, FREET4, T3FREE, THYROIDAB in the  last 72 hours. Anemia Panel: No results for input(s): VITAMINB12, FOLATE, FERRITIN, TIBC, IRON, RETICCTPCT in the last 72 hours. Urine analysis:    Component Value Date/Time   COLORURINE YELLOW 05/03/2017 1917   APPEARANCEUR HAZY (A) 05/03/2017 1917   LABSPEC 1.026 05/03/2017 1917   PHURINE 5.0 05/03/2017 1917   GLUCOSEU NEGATIVE 05/03/2017 1917   HGBUR NEGATIVE 05/03/2017 1917   BILIRUBINUR NEGATIVE 05/03/2017 1917   KETONESUR 5 (A) 05/03/2017 1917   PROTEINUR NEGATIVE 05/03/2017 1917   UROBILINOGEN 0.2 10/10/2008 1214   NITRITE NEGATIVE 05/03/2017 1917   LEUKOCYTESUR NEGATIVE 05/03/2017 1917   Sepsis Labs: @LABRCNTIP (procalcitonin:4,lacticidven:4) )No results found for this or any previous visit (from the past 240 hour(s)).   Radiological Exams on Admission: Ct Angio Head W Or Wo Contrast  Result Date: 05/03/2017 CLINICAL DATA:  Stroke and aphasia. EXAM: CT  ANGIOGRAPHY HEAD AND NECK TECHNIQUE: Multidetector CT imaging of the head and neck was performed using the standard protocol during bolus administration of intravenous contrast. Multiplanar CT image reconstructions and MIPs were obtained to evaluate the vascular anatomy. Carotid stenosis measurements (when applicable) are obtained utilizing NASCET criteria, using the distal internal carotid diameter as the denominator. CONTRAST:  50 cc Isovue 370 intravenous COMPARISON:  Noncontrast head CT from earlier today FINDINGS: CTA NECK FINDINGS Aortic arch: 3 vessel branching.  No abnormal finding Right carotid system: Small volume atherosclerotic plaque at the carotid bulb. No stenosis, ulceration, or dissection. ICA tortuosity before the skullbase. Left carotid system: Mild atherosclerotic plaque at the ICA bulb. No stenosis, ulceration, or dissection. ICA tortuosity before the skullbase. Vertebral arteries: No proximal subclavian stenosis. Right vertebral dominance, smooth and widely patent to the dura. Subclavian and vertebral origin plaque with moderate stenosis. There is irregular narrowing of the left distal V2 and V3 segments with faint flow via the level of the dura. The distal V4 segment sizable. Skeleton: No acute or aggressive finding Other neck: No evidence of inflammation. Prominent lingual tonsil, partially effacing the vallecula. Upper chest: Airway thickening at the apices. Review of the MIP images confirms the above findings CTA HEAD FINDINGS Anterior circulation: Smooth and widely patent carotid siphons. No major branch occlusion, beading, or stenosis. Negative for aneurysm. Posterior circulation: Asymmetric poor flow in the left vertebral artery with severe proximal V4 segment stenosis. Left PICA is patent. The basilar is smooth and widely patent, as are the bilateral PCA. Venous sinuses: Patent Anatomic variants: None significant Delayed phase: No abnormal intracranial enhancement. These results were  called by telephone at the time of interpretation on 05/03/2017 at 9:36 pm to Dr. Ritta Slot , who verbally acknowledged these results. Review of the MIP images confirms the above findings IMPRESSION: 1. 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 There is severe stenosis just beyond the dura, flow to the left PICA is likely retrograde. 2. Very mild atheromatous changes in the cervical carotids. No stenosis or branch occlusion in the anterior circulation. Electronically Signed   By: Marnee Spring M.D.   On: 05/03/2017 21:36   Ct Angio Neck W Or Wo Contrast  Result Date: 05/03/2017 CLINICAL DATA:  Stroke and aphasia. EXAM: CT ANGIOGRAPHY HEAD AND NECK TECHNIQUE: Multidetector CT imaging of the head and neck was performed using the standard protocol during bolus administration of intravenous contrast. Multiplanar CT image reconstructions and MIPs were obtained to evaluate the vascular anatomy. Carotid stenosis measurements (when applicable) are obtained utilizing NASCET criteria, using the distal internal carotid diameter as the  denominator. CONTRAST:  50 cc Isovue 370 intravenous COMPARISON:  Noncontrast head CT from earlier today FINDINGS: CTA NECK FINDINGS Aortic arch: 3 vessel branching.  No abnormal finding Right carotid system: Small volume atherosclerotic plaque at the carotid bulb. No stenosis, ulceration, or dissection. ICA tortuosity before the skullbase. Left carotid system: Mild atherosclerotic plaque at the ICA bulb. No stenosis, ulceration, or dissection. ICA tortuosity before the skullbase. Vertebral arteries: No proximal subclavian stenosis. Right vertebral dominance, smooth and widely patent to the dura. Subclavian and vertebral origin plaque with moderate stenosis. There is irregular narrowing of the left distal V2 and V3 segments with faint flow via the level of the dura. The distal V4 segment sizable. Skeleton: No acute or aggressive  finding Other neck: No evidence of inflammation. Prominent lingual tonsil, partially effacing the vallecula. Upper chest: Airway thickening at the apices. Review of the MIP images confirms the above findings CTA HEAD FINDINGS Anterior circulation: Smooth and widely patent carotid siphons. No major branch occlusion, beading, or stenosis. Negative for aneurysm. Posterior circulation: Asymmetric poor flow in the left vertebral artery with severe proximal V4 segment stenosis. Left PICA is patent. The basilar is smooth and widely patent, as are the bilateral PCA. Venous sinuses: Patent Anatomic variants: None significant Delayed phase: No abnormal intracranial enhancement. These results were called by telephone at the time of interpretation on 05/03/2017 at 9:36 pm to Dr. Ritta Slot , who verbally acknowledged these results. Review of the MIP images confirms the above findings IMPRESSION: 1. 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 There is severe stenosis just beyond the dura, flow to the left PICA is likely retrograde. 2. Very mild atheromatous changes in the cervical carotids. No stenosis or branch occlusion in the anterior circulation. Electronically Signed   By: Marnee Spring M.D.   On: 05/03/2017 21:36   Dg Chest Port 1 View  Result Date: 05/03/2017 CLINICAL DATA:  Acute fatigue and weakness. EXAM: PORTABLE CHEST 1 VIEW COMPARISON:  08/03/2009 and prior chest radiographs FINDINGS: The cardiomediastinal silhouette is unremarkable. There is no evidence of focal airspace disease, pulmonary edema, suspicious pulmonary nodule/mass, pleural effusion, or pneumothorax. No acute bony abnormalities are identified. IMPRESSION: No active disease. Electronically Signed   By: Harmon Pier M.D.   On: 05/03/2017 20:12   Ct Head Code Stroke W/o Cm  Result Date: 05/03/2017 CLINICAL DATA:  Code stroke.  Aphasia.  Sudden onset confusion. EXAM: CT HEAD WITHOUT CONTRAST  TECHNIQUE: Contiguous axial images were obtained from the base of the skull through the vertex without intravenous contrast. COMPARISON:  None. FINDINGS: Brain: No evidence of acute infarction, hemorrhage, hydrocephalus. Small incidental arachnoid cyst in the medial left temporal fossa measuring 20 x 10 mm. Vascular: No hyperdense vessel. Scattered atherosclerotic calcification. Skull: Negative Sinuses/Orbits: Negative Other: Text page with results sent on 05/03/2017 at 7:32 pm to Dr. Amada Jupiter ASPECTS [facility] Stroke Program Early CT Score) - Ganglionic level infarction (caudate, lentiform nuclei, internal capsule, insula, M1-M3 cortex): 7 - Supraganglionic infarction (M4-M6 cortex): 3 Total score (0-10 with 10 being normal): 10 IMPRESSION: No acute finding.  ASPECTS is 10. Electronically Signed   By: Marnee Spring M.D.   On: 05/03/2017 19:33    EKG: Independently reviewed. Sinus rhythm, early R-transition.   Assessment/Plan  1. Expressive aphasia, suspected stroke  - Pt presents with 1 day of fatigue and non-specific malaise, and expressive aphasia that developed shortly prior to arrival  - Initial workup in ED is unremarkable -  CTA with irregularity in non-dominant left vertebral artery - Neurology is consulting and much appreciated  - tPA not given d/t anticoagulation  - Swallow screen passed in ED - Monitor on telemetry with frequent neuro checks; obtain MRI brain, MRA head, echo, fasting lipids, A1c  - Continue Xarelto, start ASA, maintain normothermia and euglycemia, maintain euvolemia and permit HTN in acute phase  - PT/OT/SLP evals requested  2. Paroxysmal atrial fibrillation - In a sinus rhythm  - CHADS-VASc at least 2 (DM, HTN) - Continue Xarelto, continue telemetry monitoring   3. Hypertension - BP slightly elevated on admission  - Hold Lopressor in acute phase CVA, resume as appropriate   4. Type II DM  - No A1c on file - Managed with metformin only at home; held  currently  - Check CBG's with meals and qHS  - Start low-intensity Novolog correctional    DVT prophylaxis: Xarelto  Code Status: Full  Family Communication: Wife, son, and daughter updated at bedside with patient's permission Disposition Plan: Observe on telemetry Consults called: Neurology Admission status: Observation    Briscoe Deutscherimothy S Schelly Chuba, MD [facility] Pager [phone]  If 7PM-7AM, please contact night-coverage [email or web address] Password [email or web address]  05/03/2017, 10:42 PM

## 2017-05-03 NOTE — ED Notes (Signed)
Hospitalist at the bedside 

## 2017-05-03 NOTE — ED Triage Notes (Signed)
Received pt from home with c/o feeling fatigue all day. Around 1730 per family pt appeared confused with bizarre behavior. Pt reported that yesterday while driving he began to see spots and feeling faint. Pt found a tick on him Wednesday. No other deficits noted.

## 2017-05-03 NOTE — Consult Note (Signed)
Neurology Consultation Reason for Consult: Aphasia Referring Physician: Anitra LauthPlunkett, W  CC: Aphasia  History is obtained from: Patient, Wife  HPI: Gerald Baker is a 66 y.o. male with a history of afib who presents with mild aphasia. He was in his normal state of health until sometime between 5 and 5:30 when he began having some difficulty with his speech. He states that it has been a static deficit since onset.  He has been fatigued all day, but no acute mental status changes until 5 PM.   LKW: 5 PM tpa given?: no, anticoagulated Premorbid modified rankin scale: 0    ROS: A 14 point ROS was performed and is negative except as noted in the HPI.   Past Medical History:  Diagnosis Date  . Diabetes mellitus without complication (HCC)   . Persistent atrial fibrillation (HCC)    chads2vasc is 2.  on xarelto     Family History  Problem Relation Age of Onset  . Heart disease Father   . Heart failure Father      Social History:  reports that he has quit smoking. He has never used smokeless tobacco. He reports that he drinks alcohol. He reports that he does not use drugs.   Exam: Current vital signs: BP (!) 173/94   Pulse 85   Temp 99.5 F (37.5 C) (Rectal)   Resp 19   SpO2 98%  Vital signs in last 24 hours: Temp:  [98.9 F (37.2 C)-99.5 F (37.5 C)] 99.5 F (37.5 C) (05/12 2017) Pulse Rate:  [85-88] 85 (05/12 2000) Resp:  [16-19] 19 (05/12 2000) BP: (151-173)/(93-94) 173/94 (05/12 2000) SpO2:  [97 %-98 %] 98 % (05/12 2000)   Physical Exam  Constitutional: Appears well-developed and well-nourished.  Psych: Affect appropriate to situation Eyes: No scleral injection HENT: No OP obstrucion Head: Normocephalic.  Cardiovascular: Normal rate and regular rhythm.  Respiratory: Effort normal and breath sounds normal to anterior ascultation GI: Soft.  No distension. There is no tenderness.  Skin: WDI  Neuro: Mental Status: Patient is awake, alert, oriented to person,  place, month, year, and situation. Patient is able to give a clear and coherent history. No signs of neglect,  He makes frequent substitution errors and some paraphasic errors. Cranial Nerves: II: Visual Fields are full. Pupils are equal, round, and reactive to light.   III,IV, VI: EOMI without ptosis or diploplia.  V: Facial sensation is symmetric to temperature VII: Facial movement is symmetric.  VIII: hearing is intact to voice X: Uvula elevates symmetrically XI: Shoulder shrug is symmetric. XII: tongue is midline without atrophy or fasciculations.  Motor: Tone is normal. Bulk is normal. 5/5 strength was present in all four extremities.  Sensory: Sensation is symmetric to light touch and temperature in the arms and legs. Deep Tendon Reflexes: 2+ and symmetric in the biceps and patellae.  Plantars: Toes are downgoing bilaterally.  Cerebellar: FNF and HKS are intact bilaterally   I have reviewed labs in epic and the results pertinent to this consultation are: CMP-unremarkable  I have reviewed the images obtained: CT head-no acute findings  Impression: 66 year old male with mild aphasia in the setting of anticoagulation for atrial fibrillation. I suspect that this does represent a small infarct, likely left temporal lobe. He is not a TPA candidate given his anticoagulation and his symptoms are relatively mild to consider intra-arterial intervention.  Recommendations: 1. HgbA1c, fasting lipid panel 2. MRI  of the brain without contrast 3. Frequent neuro checks 4. Echocardiogram 5.  CTA head and neck 6. Prophylactic therapy-Antiplatelet med: Aspirin - dose 325mg  PO or 300mg  PR 7. Risk factor modification 8. Telemetry monitoring 9. PT consult, OT consult, Speech consult 10. please page stroke NP  Or  PA  Or MD  from 8am -4 pm as this patient will be followed by the stroke team at this point.   You can look them up on [email or web address]      Ritta Slot, MD [facility] [phone]  If 7pm- 7am, please page neurology on call as listed in AMION.

## 2017-05-03 NOTE — ED Notes (Signed)
Pt transported to CT with Windy KalataYasemia RN

## 2017-05-03 NOTE — ED Notes (Signed)
Report attempted, RN to call back on 10 min.

## 2017-05-03 NOTE — ED Notes (Signed)
Patient transported to MRI 

## 2017-05-04 DIAGNOSIS — I480 Paroxysmal atrial fibrillation: Secondary | ICD-10-CM | POA: Diagnosis not present

## 2017-05-04 DIAGNOSIS — R4701 Aphasia: Secondary | ICD-10-CM | POA: Diagnosis not present

## 2017-05-04 DIAGNOSIS — R0989 Other specified symptoms and signs involving the circulatory and respiratory systems: Secondary | ICD-10-CM

## 2017-05-04 DIAGNOSIS — I10 Essential (primary) hypertension: Secondary | ICD-10-CM | POA: Diagnosis not present

## 2017-05-04 LAB — GLUCOSE, CAPILLARY
GLUCOSE-CAPILLARY: 122 mg/dL — AB (ref 65–99)
GLUCOSE-CAPILLARY: 148 mg/dL — AB (ref 65–99)
GLUCOSE-CAPILLARY: 155 mg/dL — AB (ref 65–99)
Glucose-Capillary: 199 mg/dL — ABNORMAL HIGH (ref 65–99)
Glucose-Capillary: 115 mg/dL — ABNORMAL HIGH (ref 65–99)

## 2017-05-04 LAB — LIPID PANEL
Cholesterol: 148 mg/dL (ref 0–200)
HDL: 44 mg/dL (ref >40)
LDL CALC: 79 mg/dL (ref 0–99)
Total CHOL/HDL Ratio: 3.4 RATIO
Triglycerides: 126 mg/dL (ref <150)
VLDL: 25 mg/dL (ref 0–40)

## 2017-05-04 LAB — HIV ANTIBODY (ROUTINE TESTING W REFLEX): HIV Screen 4th Generation wRfx: NONREACTIVE

## 2017-05-04 MED ORDER — FLECAINIDE ACETATE 50 MG PO TABS: 450.0000 mg | ORAL_TABLET | Freq: Once | ORAL | Status: DC | PRN

## 2017-05-04 MED ORDER — METOPROLOL TARTRATE 100 MG PO TABS
100.0000 mg | ORAL_TABLET | Freq: Two times a day (BID) | ORAL | Status: DC
  Administered 2017-05-04 – 2017-05-05 (×3): 100 mg via ORAL
  Filled 2017-05-04 (×3): qty 1

## 2017-05-04 MED ORDER — METFORMIN HCL ER 500 MG PO TB24
1000.0000 mg | ORAL_TABLET | Freq: Two times a day (BID) | ORAL | Status: DC
  Administered 2017-05-04 – 2017-05-05 (×2): 1000 mg via ORAL
  Filled 2017-05-04 (×2): qty 2

## 2017-05-04 NOTE — Progress Notes (Addendum)
Patient ID: Gerald Baker, male   DOB: 1951/07/22, 66 y.o.   MRN: [id number]  PROGRESS NOTE    Gerald Baker  WUJ:[id number] DOB: 10/09/1951 DOA: 05/03/2017  PCP: Kirby Funk, MD   Brief Narrative:  66 year old male with atrial fibrillation on xarelto who presented to [facility] with aphasia the night prior to the admission. CT head showed no acute findings. CT angiogram showed possible dissection. MRI did not show acute stroke. Neurology will see the pt in consultation.    Assessment & Plan:   Principal Problem:  Expressive aphasia Stroke work up initiated:  - Aspirin daily - CT head - no acute findings  - Ct Angio Head and neck- 1. 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 There is severe stenosis just beyond the dura, flow to the left PICA is likely retrograde. 2. Very mild atheromatous changes in the cervical carotids. No stenosis or branch occlusion in the anterior circulation.  - MRI/MRA -1. Crescentic increased T2 signal is present within the left V4 segment proximal to left PICA origin compatible with dissection as seen on CT angiogram. 2. Gradient diminished flow related signal within the left V4 segment from left vertebrobasilar junction to PICA origin indicating reversal of flow. 3. Otherwise patent circle of Willis. No additional evidence for large vessel occlusion, aneurysm, or significant stenosis is identified. 4. No evidence of acute infarction, hemorrhage, or focal mass effect of the brain. 5. Mild chronic microvascular ischemic changes of the brain. - 2D ECHO - pending  - Carotid doppler - pending  - HgbA1c, Lipid panel - LDL 79. LDL goal < 100 - Diet: regular  - Therapy: PT/OT - pending   Active Problems:   HTN (hypertension), essential - BP stable, resuemd metoprolol     Paroxysmal atrial fibrillation (HCC)  - CHADS vasc score at least 3 - On AC with xarelto  - Resume metoprolol and flecainide     Diabetes mellitus  without complications - Resume metformin    DVT prophylaxis: xarelto  Code Status: full code  Family Communication: wife at bedside  Disposition Plan: SNF versus home depending on PT eval   Consultants:   Neurology   Procedures:   ECHO - pending   Antimicrobials:   None    Subjective: No overnight events.  Objective: Vitals:   05/04/17 0424 05/04/17 0624 05/04/17 0800 05/04/17 1000  BP: 136/73 140/84  (!) 136/99  Pulse: 76 82  78  Resp: 16 16  18   Temp: 98.1 F (36.7 C) 98.8 F (37.1 C) 98.6 F (37 C) 98.1 F (36.7 C)  TempSrc: Oral Oral Oral Oral  SpO2: 93% 96%  94%  Weight:  127 kg (280 lb)    Height:  6' (1.829 m)     No intake or output data in the 24 hours ending 05/04/17 1315 Filed Weights   05/04/17 0624  Weight: 127 kg (280 lb)    Examination:  General exam: Appears calm and comfortable  Respiratory system: Clear to auscultation. Respiratory effort normal. Cardiovascular system: S1 & S2 heard, RRR.  Gastrointestinal system: Abdomen is nondistended, soft and nontender. No organomegaly or masses felt. Normal bowel sounds heard. Central nervous system: Alert and oriented. Still says his speech is not at baseline  Extremities: Symmetric 5 x 5 power. Skin: No rashes, lesions or ulcers Psychiatry: Judgement and insight appear normal. Mood & affect appropriate.   Data Reviewed: I have personally reviewed following labs and imaging studies  CBC:  Recent Labs Lab 05/03/17 1925 05/03/17 1936  WBC 9.4  --   NEUTROABS 5.6  --   HGB 14.4 15.3  HCT 42.7 45.0  MCV 89.7  --   PLT 234  --    Basic Metabolic Panel:  Recent Labs Lab 05/03/17 1925 05/03/17 1936  NA 135 137  K 4.1 4.1  CL 101 101  CO2 25  --   GLUCOSE 139* 140*  BUN 13 14  CREATININE 0.86 0.80  CALCIUM 8.9  --    GFR: Estimated Creatinine Clearance: 130.2 mL/min (by C-G formula based on SCr of 0.8 mg/dL). Liver Function Tests:  Recent Labs Lab 05/03/17 1925  AST 27    ALT 30  ALKPHOS 55  BILITOT 0.5  PROT 6.7  ALBUMIN 3.6   No results for input(s): LIPASE, AMYLASE in the last 168 hours. No results for input(s): AMMONIA in the last 168 hours. Coagulation Profile:  Recent Labs Lab 05/03/17 1925  INR 1.11   Cardiac Enzymes: No results for input(s): CKTOTAL, CKMB, CKMBINDEX, TROPONINI in the last 168 hours. BNP (last 3 results) No results for input(s): PROBNP in the last 8760 hours. HbA1C: No results for input(s): HGBA1C in the last 72 hours. CBG:  Recent Labs Lab 05/03/17 1911 05/04/17 0033 05/04/17 0628 05/04/17 1220  GLUCAP 134* 148* 122* 199*   Lipid Profile:  Recent Labs  05/04/17 0528  CHOL 148  HDL 44  LDLCALC 79  TRIG 126  CHOLHDL 3.4   Thyroid Function Tests: No results for input(s): TSH, T4TOTAL, FREET4, T3FREE, THYROIDAB in the last 72 hours. Anemia Panel: No results for input(s): VITAMINB12, FOLATE, FERRITIN, TIBC, IRON, RETICCTPCT in the last 72 hours. Urine analysis:    Component Value Date/Time   COLORURINE YELLOW 05/03/2017 1917   APPEARANCEUR HAZY (A) 05/03/2017 1917   LABSPEC 1.026 05/03/2017 1917   PHURINE 5.0 05/03/2017 1917   GLUCOSEU NEGATIVE 05/03/2017 1917   HGBUR NEGATIVE 05/03/2017 1917   BILIRUBINUR NEGATIVE 05/03/2017 1917   KETONESUR 5 (A) 05/03/2017 1917   PROTEINUR NEGATIVE 05/03/2017 1917   UROBILINOGEN 0.2 10/10/2008 1214   NITRITE NEGATIVE 05/03/2017 1917   LEUKOCYTESUR NEGATIVE 05/03/2017 1917   Sepsis Labs: @LABRCNTIP (procalcitonin:4,lacticidven:4)   )No results found for this or any previous visit (from the past 240 hour(s)).    Radiology Studies: Ct Angio Head W Or Wo Contrast Result Date: 05/03/2017  1. 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 There is severe stenosis just beyond the dura, flow to the left PICA is likely retrograde. 2. Very mild atheromatous changes in the cervical carotids. No stenosis or branch occlusion in  the anterior circulation. Electronically Signed   By: Marnee Spring M.D.   On: 05/03/2017 21:36   Ct Angio Neck W Or Wo Contrast Result Date: 05/03/2017 1. 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 There is severe stenosis just beyond the dura, flow to the left PICA is likely retrograde. 2. Very mild atheromatous changes in the cervical carotids. No stenosis or branch occlusion in the anterior circulation. Electronically Signed   By: Marnee Spring M.D.   On: 05/03/2017 21:36   Mr Brain Wo Contrast Result Date: 05/04/2017  1. Crescentic increased T2 signal is present within the left V4 segment proximal to left PICA origin compatible with dissection as seen on CT angiogram. 2. Gradient diminished flow related signal within the left V4 segment from left vertebrobasilar junction to PICA  origin indicating reversal of flow. 3. Otherwise patent circle of Willis. No additional evidence for large vessel occlusion, aneurysm, or significant stenosis is identified. 4. No evidence of acute infarction, hemorrhage, or focal mass effect of the brain. 5. Mild chronic microvascular ischemic changes of the brain. Electronically Signed   By: Mitzi HansenLance  Furusawa-Stratton M.D.   On: 05/04/2017 00:05   Dg Chest Port 1 View Result Date: 05/03/2017 No active disease.   Mr Maxine GlennMra Head/brain ZOWo Cm Result Date: 05/04/2017 1. Crescentic increased T2 signal is present within the left V4 segment proximal to left PICA origin compatible with dissection as seen on CT angiogram. 2. Gradient diminished flow related signal within the left V4 segment from left vertebrobasilar junction to PICA origin indicating reversal of flow. 3. Otherwise patent circle of Willis. No additional evidence for large vessel occlusion, aneurysm, or significant stenosis is identified. 4. No evidence of acute infarction, hemorrhage, or focal mass effect of the brain. 5. Mild chronic microvascular ischemic changes of the  brain.   Ct Head Code Stroke W/o Cm  Result Date: 05/03/2017 No acute finding.       Scheduled Meds: . aspirin  300 mg Rectal Daily   Or  . aspirin  325 mg Oral Daily  . insulin aspart  0-5 Units Subcutaneous QHS  . insulin aspart  0-9 Units Subcutaneous TID WC  . rivaroxaban  20 mg Oral Q supper  . sodium chloride flush  3 mL Intravenous Q12H   Continuous Infusions: . sodium chloride       LOS: 0 days    Time spent: 25 minutes  Greater than 50% of the time spent on counseling and coordinating the care.   Manson PasseyAlma Gage Weant, MD [facility] Pager [phone]  If 7PM-7AM, please contact night-coverage [email or web address] Password [email or web address] 05/04/2017, 1:15 PM

## 2017-05-04 NOTE — Progress Notes (Signed)
STROKE TEAM PROGRESS NOTE   HISTORY OF PRESENT ILLNESS (per record) Ronny BaconHarold P Harmes is a 66 y.o. male with a history of afib who presents with mild aphasia. He was in his normal state of health until sometime between 5 and 5:30 when he began having some difficulty with his speech. He states that it has been a static deficit since onset. He has been fatigued all day, but no acute mental status changes until 5 PM.  LKW: 5 PM tpa given?: no, anticoagulated Premorbid modified rankin scale: 0   SUBJECTIVE (INTERVAL HISTORY) His  Wife, daughter and son in law are  at the bedside.  He states 2 days ago he had a transient episod He did have symptoms of nearly passing out prior to onset o expressive language difficulties yes He does to exam to for A. Fib but states it is not consistent with either scheduled timeof taking xarelto or  of the meal size with which he takes it   OBJECTIVE Temp:  [98.1 F (36.7 C)-99.5 F (37.5 C)] 98.4 F (36.9 C) (05/13 1400) Pulse Rate:  [63-98] 63 (05/13 1400) Cardiac Rhythm: Normal sinus rhythm (05/13 0700) Resp:  [16-21] 18 (05/13 1400) BP: (136-173)/(73-99) 158/93 (05/13 1400) SpO2:  [93 %-98 %] 96 % (05/13 1400) Weight:  [127 kg (280 lb)] 127 kg (280 lb) (05/13 0624)  CBC:   Recent Labs Lab 05/03/17 1925 05/03/17 1936  WBC 9.4  --   NEUTROABS 5.6  --   HGB 14.4 15.3  HCT 42.7 45.0  MCV 89.7  --   PLT 234  --     Basic Metabolic Panel:   Recent Labs Lab 05/03/17 1925 05/03/17 1936  NA 135 137  K 4.1 4.1  CL 101 101  CO2 25  --   GLUCOSE 139* 140*  BUN 13 14  CREATININE 0.86 0.80  CALCIUM 8.9  --     Lipid Panel:     Component Value Date/Time   CHOL 148 05/04/2017 0528   TRIG 126 05/04/2017 0528   HDL 44 05/04/2017 0528   CHOLHDL 3.4 05/04/2017 0528   VLDL 25 05/04/2017 0528   LDLCALC 79 05/04/2017 0528   HgbA1c: No results found for: HGBA1C Urine Drug Screen:     Component Value Date/Time   LABOPIA NONE DETECTED  05/03/2017 1917   COCAINSCRNUR NONE DETECTED 05/03/2017 1917   LABBENZ NONE DETECTED 05/03/2017 1917   AMPHETMU NONE DETECTED 05/03/2017 1917   THCU NONE DETECTED 05/03/2017 1917   LABBARB NONE DETECTED 05/03/2017 1917    Alcohol Level     Component Value Date/Time   ETH <5 05/03/2017 1925    IMAGING  Ct Angio Head W Or Wo Contrast Ct Angio Neck W Or Wo Contrast 05/03/2017 1. 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 There is severe stenosis just beyond the dura, flow to the left PICA is likely retrograde.  2. Very mild atheromatous changes in the cervical carotids. No stenosis or branch occlusion in the anterior circulation.     Mr Maxine GlennMra Head/brain Wo Cm 05/04/2017 1. Crescentic increased T2 signal is present within the left V4 segment proximal to left PICA origin compatible with dissection as seen on CT angiogram.  2. Gradient diminished flow related signal within the left V4 segment from left vertebrobasilar junction to PICA origin indicating reversal of flow.  3. Otherwise patent circle of Willis. No additional evidence for large vessel occlusion, aneurysm, or significant stenosis is identified.  4. No evidence of acute infarction, hemorrhage, or focal mass effect of the brain.  5. Mild chronic microvascular ischemic changes of the brain.     Dg Chest Port 1 View 05/03/2017 No active disease.     Ct Head Code Stroke W/o Cm 05/03/2017 No acute finding.  ASPECTS is 10.     PHYSICAL EXAM Pleasant middle aged Caucasian male not in distress. . Afebrile. Head is nontraumatic. Neck is supple without bruit.    Cardiac exam no murmur or gallop. Lungs are clear to auscultation. Distal pulses are well felt.  Neurological Exam ;  Awake  Alert oriented x 3. Normal speech and language.eye movements full without nystagmus.fundi were not visualized. Vision acuity and fields appear normal. Hearing is normal. Palatal movements are normal.  Face symmetric. Tongue midline. Normal strength, tone, reflexes and coordination. Normal sensation. Gait deferred.      ASSESSMENT/PLAN Mr. SANTANNA OLENIK is a 66 y.o. male with history of permanent atrial fibrillation (Xarelto), hypertension, and diabetes mellitus presenting with aphasia. He did not receive IV t-PA due to anticoagulation.  Possible TIA:  Embolic - secondary to vertebral artery dissection versus atrial fibrillation.  Resultant  No deficits  MRI - No evidence of acute infarction  MRA - consistent with left vertebral artery dissection  Carotid Doppler - CTA neck  2D Echo - pending  LDL - 79  HgbA1c pending  VTE prophylaxis - Xarelto Diet Carb Modified Fluid consistency: Thin; Room service appropriate? Yes  Xarelto (rivaroxaban) daily prior to admission, now on Xarelto (rivaroxaban) daily and ASA 325 mg daily.  Patient counseled to be compliant with his antithrombotic medications  Ongoing aggressive stroke risk factor management  Therapy recommendations:  pending  Disposition: Pending  Hypertension  Blood pressure running mildly high.  Permissive hypertension (OK if < 220/120) but gradually normalize in 5-7 days  Long-term BP goal normotensive  Hyperlipidemia  Home meds: No statin medications prior to admission  LDL 79, goal < 70  Consider low-dose statin therapy  Diabetes  HgbA1c pending, goal < 7.0  Unc / Controlled  Other Stroke Risk Factors  Advanced age  Former smoker - quit  ETOH use, advised to drink no more than 1 drink per day  Obesity, Body mass index is 37.97 kg/m., recommend weight loss, diet and exercise as appropriate   Permanent atrial fibrillation  Other Active Problems  Left vertebral artery dissection   Hospital day # 0  I have personally examined this patient, reviewed notes, independently viewed imaging studies, participated in medical decision making and plan of care.ROS completed by me personally and  pertinent positives fully documented  I have made any additions or clarifications directly to the above note.  He presented with transient expressive language difficulties do to left hemispheric TIA from atrial fibrillation despite taking xarelto.I discussed alternative treatment choices-pradaxa, eliquis, sayvasa and clearly stated lack of definitive data suggesting superiority of the the near agents compared to each other. Patient and family want to change to Pradaxa and I am in agreement.Greater than 50% time during this 35 minute visit to was spent on counseling and coordination of care  Atrial fibrillation, stroke evaluation and treatment discussion and answering questions.  Delia Heady, MD Medical Director [facility] Pager: [phone] 05/04/2017 5:27 PM   To contact Stroke Continuity provider, please refer to [email or web address]. After hours, contact General Neurology

## 2017-05-04 NOTE — Evaluation (Signed)
Speech Language Pathology Evaluation Patient Details Name: Gerald BaconHarold P Hardwick MRN: [id number] DOB: 17-May-1951 Today's Date: 05/04/2017 Time: [id number] SLP Time Calculation (min) (ACUTE ONLY): 17 min  Problem List:  Patient Active Problem List   Diagnosis Date Noted  . Expressive aphasia 05/03/2017  . Suspected stroke patient last known to be well more than 2 hours ago 05/03/2017  . Chronic anticoagulation 05/10/2014  . Chronic diastolic heart failure (HCC) 05/10/2014  . HTN (hypertension) 01/20/2014  . Diabetes mellitus (HCC) 01/20/2014  . Dyslipidemia 01/20/2014  . Obesity 01/20/2014  . Paroxysmal atrial fibrillation (HCC) 01/20/2014  . Diverticulosis 01/20/2014  . 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 Heme positive stool 01/20/2014  . Varicose vein of leg 01/20/2014   Past Medical History:  Past Medical History:  Diagnosis Date  . Diabetes mellitus without complication (HCC)   . Hypertension   . Persistent atrial fibrillation (HCC)    chads2vasc is 2.  on xarelto   Past Surgical History:  Past Surgical History:  Procedure Laterality Date  . CARDIOVERSION N/A 10/19/2015   Procedure: CARDIOVERSION;  Surgeon: Chilton Siiffany [place], MD;  [facility];  Service: Cardiovascular;  Laterality: N/A;  . KNEE ARTHROSCOPY     HPI:  66 year old male with atrial fibrillation on xarelto who [facility] the night prior to the admission. CT head showed no acute findings. CT angiogram showed possible dissection. MRI did not show acute stroke.   Assessment / Plan / Recommendation Clinical Impression  Pt presents with resolving communication deficits - only mild deficits in divergent naming; responsive and confrontational naming WNL.  Output is fluent with appropriate grammatical/semantic form; comprehension is wnl.  No dysarthria.  No SLP f/u is needed - our services will sign off.      SLP Assessment  SLP Recommendation/Assessment: Patient does not need any further Speech  [facility] SLP Visit Diagnosis: Cognitive communication deficit (R41.841)    Follow Up Recommendations  None    Frequency and Duration           SLP Evaluation Cognition  Overall Cognitive Status: Within Functional Limits for tasks assessed       Comprehension  Auditory Comprehension Overall Auditory Comprehension: Appears within functional limits for tasks assessed Visual Recognition/Discrimination Discrimination: Within Function Limits Reading Comprehension Reading Status: Within funtional limits    Expression Expression Primary Mode of Expression: Verbal Verbal Expression Overall Verbal Expression: Appears within functional limits for tasks assessed Written Expression Dominant Hand: Right   Oral / Motor  Oral Motor/Sensory Function Overall Oral Motor/Sensory Function: Within functional limits Motor Speech Overall Motor Speech: Appears within functional limits for tasks assessed   GO          Functional Assessment Tool Used: clinical judgment Functional Limitations: Spoken language expressive Spoken Language Expression Current Status (Y7829(G9162): At least 1 percent but less than 20 percent impaired, limited or restricted Spoken Language Expression Goal Status [phone](G9163): At least 1 percent but less than 20 percent impaired, limited or restricted Spoken Language Expression Discharge Status [phone](G9164): At least 1 percent but less than 20 percent impaired, limited or restricted         Blenda MountsCouture, Patton Rabinovich Laurice 05/04/2017, 3:56 PM

## 2017-05-04 NOTE — Evaluation (Signed)
Physical Therapy Evaluation Patient Details Name: Gerald Baker MRN: [id number] DOB: 04-12-1951 Today's Date: 05/04/2017   History of Present Illness  Pt admitted with malaise, fatigue and speech difficulty. MRI negative for CVA. PMH: DM, HTN, afib.  Clinical Impression  Patient is functioning at independent level with all mobility and gait.  Able to negotiate stairs.  Patient scored 22/24 on DGI balance assessment indicating low fall risk.  No acute PT needs identified - PT will sign off.    Follow Up Recommendations No PT follow up    Equipment Recommendations  None recommended by PT    Recommendations for Other Services       Precautions / Restrictions Precautions Precautions: None Restrictions Weight Bearing Restrictions: No      Mobility  Bed Mobility               General bed mobility comments: Patient was in chair  Transfers Overall transfer level: Independent Equipment used: None                Ambulation/Gait Ambulation/Gait assistance: Independent Ambulation Distance (Feet): 220 Feet Assistive device: None Gait Pattern/deviations: WFL(Within Functional Limits)   Gait velocity interpretation: at or above normal speed for age/gender General Gait Details: Good gait pattern, balance, and speed.  Patient with Rt knee difficulties at times.  Only noticed difficulty on stairs.  Stairs Stairs: Yes Stairs assistance: Modified independent (Device/Increase time) Stair Management: One rail Left;Step to pattern;Forwards Number of Stairs: 3 General stair comments: Patient used step-to pattern due to Rt knee.  Ascended stairs with no rail.  Had to use rail to descend stairs.  No physical assist needed.  Wheelchair Mobility    Modified Rankin (Stroke Patients Only) Modified Rankin (Stroke Patients Only) Pre-Morbid Rankin Score: No symptoms Modified Rankin: No symptoms     Balance Overall balance assessment: Independent                                Standardized Balance Assessment Standardized Balance Assessment : Dynamic Gait Index   Dynamic Gait Index Level Surface: Normal Change in Gait Speed: Normal Gait with Horizontal Head Turns: Normal Gait with Vertical Head Turns: Normal Gait and Pivot Turn: Normal Step Over Obstacle: Normal Step Around Obstacles: Normal Steps: Moderate Impairment Total Score: 22       Pertinent Vitals/Pain Pain Assessment: No/denies pain    Home Living Family/patient expects to be discharged to:: Private residence Living Arrangements: Spouse/significant other Available Help at Discharge: Family;Available 24 hours/day Type of Home: House Home Access: Stairs to enter Entrance Stairs-Rails: None Entrance Stairs-Number of Steps: 1 Home Layout: One level Home Equipment: None      Prior Function Level of Independence: Independent         Comments: active     Hand Dominance   Dominant Hand: Right    Extremity/Trunk Assessment   Upper Extremity Assessment Upper Extremity Assessment: Defer to OT evaluation    Lower Extremity Assessment Lower Extremity Assessment: Overall WFL for tasks assessed    Cervical / Trunk Assessment Cervical / Trunk Assessment: Normal  Communication   Communication: Expressive difficulties (Minimal)  Cognition Arousal/Alertness: Awake/alert Behavior During Therapy: WFL for tasks assessed/performed Overall Cognitive Status: Within Functional Limits for tasks assessed  General Comments      Exercises     Assessment/Plan    PT Assessment Patent does not need any further PT services  PT Problem List         PT Treatment Interventions      PT Goals (Current goals can be found in the Care Plan section)  Acute Rehab PT Goals PT Goal Formulation: All assessment and education complete, DC therapy    Frequency     Barriers to discharge        Co-evaluation                AM-PAC PT "6 Clicks" Daily Activity  Outcome Measure Difficulty turning over in bed (including adjusting bedclothes, sheets and blankets)?: None Difficulty moving from lying on back to sitting on the side of the bed? : None Difficulty sitting down on and standing up from a chair with arms (e.g., wheelchair, bedside commode, etc,.)?: None Help needed moving to and from a bed to chair (including a wheelchair)?: None Help needed walking in hospital room?: None Help needed climbing 3-5 steps with a railing? : None 6 Click Score: 24    End of Session   Activity Tolerance: Patient tolerated treatment well Patient left: in chair;with call bell/phone within reach;with family/visitor present Nurse Communication:  (No PT needs) PT Visit Diagnosis: Difficulty in walking, not elsewhere classified (R26.2)    Time: [id number] PT Time Calculation (min) (ACUTE ONLY): 20 min   Charges:   PT Evaluation $PT Eval Low Complexity: 1 Procedure     PT G Codes:   PT G-Codes **NOT FOR INPATIENT CLASS** Functional Assessment Tool Used: AM-PAC 6 Clicks Basic Mobility Functional Limitation: Mobility: Walking and moving around Mobility: Walking and Moving Around Current Status (V4098(G8978): 0 percent impaired, limited or restricted Mobility: Walking and Moving Around Goal Status (J1914(G8979): 0 percent impaired, limited or restricted Mobility: Walking and Moving Around Discharge Status (N8295(G8980): 0 percent impaired, limited or restricted    Durenda HurtSusan H. Renaldo Fiddleravis, PT, [facility] Acute Rehab Services Pager [phone]   Vena AustriaSusan H Jerrald Doverspike 05/04/2017, 10:15 PM

## 2017-05-04 NOTE — Evaluation (Signed)
Occupational Therapy Evaluation Patient Details Name: Gerald Baker MRN: [id number] DOB: 1951-11-05 Today's Date: 05/04/2017    History of Present Illness Pt admitted with malaise, fatigue and speech difficulty. MRI negative for CVA. PMH: DM, HTN, afib.   Clinical Impression   Pt is performing ADL independently. No OT needs.    Follow Up Recommendations  No OT follow up    Equipment Recommendations       Recommendations for Other Services       Precautions / Restrictions Precautions Precautions: None      Mobility Bed Mobility               General bed mobility comments: in chair  Transfers Overall transfer level: Independent                    Balance                                           ADL either performed or assessed with clinical judgement   ADL Overall ADL's : Independent                                             Vision Baseline Vision/History: No visual deficits Additional Comments: s/p LASIK     Perception     Praxis      Pertinent Vitals/Pain Pain Assessment: No/denies pain     Hand Dominance Right   Extremity/Trunk Assessment Upper Extremity Assessment Upper Extremity Assessment: Overall WFL for tasks assessed   Lower Extremity Assessment Lower Extremity Assessment: Defer to PT evaluation   Cervical / Trunk Assessment Cervical / Trunk Assessment: Normal   Communication Communication Communication: Expressive difficulties (some word finding difficulties)   Cognition Arousal/Alertness: Awake/alert Behavior During Therapy: WFL for tasks assessed/performed Overall Cognitive Status: Within Functional Limits for tasks assessed                                 General Comments: some mild memory deficits   General Comments       Exercises     Shoulder Instructions      Home Living Family/patient expects to be discharged to:: Private residence Living  Arrangements: Spouse/significant other Available Help at Discharge: Family;Available 24 hours/day Type of Home: House Home Access: Stairs to enter Entergy CorporationEntrance Stairs-Number of Steps: 1   Home Layout: One level     Bathroom Shower/Tub: Producer, television/film/videoWalk-in shower   Bathroom Toilet: Handicapped height     Home Equipment: None          Prior Functioning/Environment Level of Independence: Independent        Comments: active        OT Problem List: Obesity      OT Treatment/Interventions:      OT Goals(Current goals can be found in the care plan section) Acute Rehab OT Goals Patient Stated Goal: to go to his beach house tomorrow  OT Frequency:     Barriers to D/C:            Co-evaluation              AM-PAC PT "6 Clicks" Daily Activity     Outcome Measure Help from another person  eating meals?: None Help from another person taking care of personal grooming?: None Help from another person toileting, which includes using toliet, bedpan, or urinal?: None Help from another person bathing (including washing, rinsing, drying)?: None Help from another person to put on and taking off regular upper body clothing?: None Help from another person to put on and taking off regular lower body clothing?: None 6 Click Score: 24   End of Session Nurse Communication: Mobility status  Activity Tolerance: Patient tolerated treatment well Patient left: in chair;with call bell/phone within reach;with chair alarm set  OT Visit Diagnosis: Other symptoms and signs involving cognitive function                Time: 0941-1001 OT Time Calculation (min): 20 min Charges:  OT General Charges $OT Visit: 1 Procedure OT Evaluation $OT Eval Low Complexity: 1 Procedure G-Codes: OT G-codes **NOT FOR INPATIENT CLASS** Functional Assessment Tool Used: Clinical judgement Functional Limitation: Self care Self Care Current Status (Z6109): 0 percent impaired, limited or restricted Self Care Goal Status  (U0454): 0 percent impaired, limited or restricted Self Care Discharge Status (U9811): 0 percent impaired, limited or restricted   Evern Bio 05/04/2017, 10:09 AM  [phone]

## 2017-05-05 ENCOUNTER — Observation Stay (HOSPITAL_BASED_OUTPATIENT_CLINIC_OR_DEPARTMENT_OTHER): Payer: BLUE CROSS/BLUE SHIELD

## 2017-05-05 DIAGNOSIS — R4701 Aphasia: Secondary | ICD-10-CM | POA: Diagnosis not present

## 2017-05-05 DIAGNOSIS — I480 Paroxysmal atrial fibrillation: Secondary | ICD-10-CM | POA: Diagnosis not present

## 2017-05-05 DIAGNOSIS — R0989 Other specified symptoms and signs involving the circulatory and respiratory systems: Secondary | ICD-10-CM

## 2017-05-05 LAB — ECHOCARDIOGRAM COMPLETE
Height: 72 in
WEIGHTICAEL: 4480 [oz_av]

## 2017-05-05 LAB — HEMOGLOBIN A1C
HEMOGLOBIN A1C: 7.2 % — AB (ref 4.8–5.6)
MEAN PLASMA GLUCOSE: 160 mg/dL

## 2017-05-05 LAB — GLUCOSE, CAPILLARY
GLUCOSE-CAPILLARY: 148 mg/dL — AB (ref 65–99)
Glucose-Capillary: 133 mg/dL — ABNORMAL HIGH (ref 65–99)

## 2017-05-05 MED ORDER — DABIGATRAN ETEXILATE MESYLATE 150 MG PO CAPS: 150.0000 mg | ORAL_CAPSULE | Freq: Two times a day (BID) | ORAL | 0 refills | Status: DC

## 2017-05-05 NOTE — Progress Notes (Signed)
STROKE TEAM PROGRESS NOTE   HISTORY OF PRESENT ILLNESS (per record) Gerald Baker is a 66 y.o. male with a history of afib who presents with mild aphasia. He was in his normal state of health until sometime between 5 and 5:30 when he began having some difficulty with his speech. He states that it has been a static deficit since onset. He has been fatigued all day, but no acute mental status changes until 5 PM.  LKW: 5 PM tpa given?: no, anticoagulated Premorbid modified rankin scale: 0   SUBJECTIVE (INTERVAL HISTORY) His  Family is not  at the bedside.  He states he is doing well without recurrent symptoms and prefers Pradaxa over xarelto  OBJECTIVE Temp:  [98.2 F (36.8 C)-98.8 F (37.1 C)] 98.6 F (37 C) (05/14 1402) Pulse Rate:  [65-78] 71 (05/14 1402) Cardiac Rhythm: Normal sinus rhythm (05/14 0700) Resp:  [17-20] 20 (05/14 0939) BP: (113-151)/(56-82) 124/82 (05/14 1402) SpO2:  [95 %-98 %] 95 % (05/14 1402)  CBC:   Recent Labs Lab 05/03/17 1925 05/03/17 1936  WBC 9.4  --   NEUTROABS 5.6  --   HGB 14.4 15.3  HCT 42.7 45.0  MCV 89.7  --   PLT 234  --     Basic Metabolic Panel:   Recent Labs Lab 05/03/17 1925 05/03/17 1936  NA 135 137  K 4.1 4.1  CL 101 101  CO2 25  --   GLUCOSE 139* 140*  BUN 13 14  CREATININE 0.86 0.80  CALCIUM 8.9  --     Lipid Panel:     Component Value Date/Time   CHOL 148 05/04/2017 0528   TRIG 126 05/04/2017 0528   HDL 44 05/04/2017 0528   CHOLHDL 3.4 05/04/2017 0528   VLDL 25 05/04/2017 0528   LDLCALC 79 05/04/2017 0528   HgbA1c:  Lab Results  Component Value Date   HGBA1C 7.2 (H) 05/04/2017   Urine Drug Screen:     Component Value Date/Time   LABOPIA NONE DETECTED 05/03/2017 1917   COCAINSCRNUR NONE DETECTED 05/03/2017 1917   LABBENZ NONE DETECTED 05/03/2017 1917   AMPHETMU NONE DETECTED 05/03/2017 1917   THCU NONE DETECTED 05/03/2017 1917   LABBARB NONE DETECTED 05/03/2017 1917    Alcohol Level      Component Value Date/Time   ETH <5 05/03/2017 1925    IMAGING  Ct Angio Head W Or Wo Contrast Ct Angio Neck W Or Wo Contrast 05/03/2017 1. 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 There is severe stenosis just beyond the dura, flow to the left PICA is likely retrograde.  2. Very mild atheromatous changes in the cervical carotids. No stenosis or branch occlusion in the anterior circulation.     Mr Maxine Glenn Head/brain Wo Cm 05/04/2017 1. Crescentic increased T2 signal is present within the left V4 segment proximal to left PICA origin compatible with dissection as seen on CT angiogram.  2. Gradient diminished flow related signal within the left V4 segment from left vertebrobasilar junction to PICA origin indicating reversal of flow.  3. Otherwise patent circle of Willis. No additional evidence for large vessel occlusion, aneurysm, or significant stenosis is identified.  4. No evidence of acute infarction, hemorrhage, or focal mass effect of the brain.  5. Mild chronic microvascular ischemic changes of the brain.     Dg Chest Port 1 View 05/03/2017 No active disease.     Ct Head Code Stroke W/o Cm 05/03/2017 No  acute finding.  ASPECTS is 10.     PHYSICAL EXAM Pleasant middle aged Caucasian male not in distress. . Afebrile. Head is nontraumatic. Neck is supple without bruit.    Cardiac exam no murmur or gallop. Lungs are clear to auscultation. Distal pulses are well felt.  Neurological Exam ;  Awake  Alert oriented x 3. Normal speech and language.eye movements full without nystagmus.fundi were not visualized. Vision acuity and fields appear normal. Hearing is normal. Palatal movements are normal. Face symmetric. Tongue midline. Normal strength, tone, reflexes and coordination. Normal sensation. Gait deferred.      ASSESSMENT/PLAN Mr. Gerald Baker is a 66 y.o. male with history of permanent atrial fibrillation (Xarelto),  hypertension, and diabetes mellitus presenting with aphasia. He did not receive IV t-PA due to anticoagulation.  Possible TIA:  Embolic - secondary to vertebral artery dissection versus atrial fibrillation.  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 There was mild   concentric hypertrophy. Systolic function was normal. The   estimated ejection fraction was in the range of 55% to 60%. Wall   motion was normal; there were no regional wall motion    abnormalities  LDL - 79  HgbA1c 7.2  VTE prophylaxis - Xarelto Diet Carb Modified Fluid consistency: Thin; Room service appropriate? Yes  Xarelto (rivaroxaban) daily prior to admission, now on Xarelto (rivaroxaban) daily and ASA 325 mg daily.  Patient counseled to be compliant with his antithrombotic medications  Ongoing aggressive stroke risk factor management  Therapy recommendations:  None  Disposition: home  Hypertension  Blood pressure running mildly high.  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  He presented with transient expressive language difficulties do to left hemispheric TIA from atrial fibrillation despite taking xarelto.I discussed alternative treatment choices-pradaxa, eliquis, sayvasa and clearly stated lack of definitive data suggesting superiority of the the near agents compared to each other. Patient and family want to change to Pradaxa and I am in agreement.Greater than 50% time during this 15 minute visit to was spent on counseling and coordination of care  Atrial fibrillation, stroke evaluation and treatment discussion and answering questions. Recommend discharge the patient home on Pradaxa and discontinue Xarelto. Follow-up with his cardiologist Dr. Verdis PrimeHenry Smith in a few weeks and with me in the stroke clinic in 6 weeks. Stroke team will sign off. Kindly call for questions  Delia HeadyPramod Louann Hopson, MD Medical Director [facility]  Pager: [phone] 05/05/2017 3:01 PM   To contact Stroke Continuity provider, please refer to [email or web address]. After hours, contact General Neurology

## 2017-05-05 NOTE — Progress Notes (Signed)
  Echocardiogram 2D Echocardiogram has been performed.  Arvil ChacoFoster, Era Parr 05/05/2017, 11:43 AM

## 2017-05-05 NOTE — Discharge Instructions (Signed)
Dabigatran oral capsules What is this medicine? DABIGATRAN (DA bi GAT ran) is an anticoagulant (blood thinner). It is used to lower the chance of stroke in people with a medical condition called atrial fibrillation. It is also used to treat or prevent blood clots in the lungs or in the veins. This medicine may be used for other purposes; ask your health care provider or pharmacist if you have questions. COMMON BRAND NAME(S): Pradaxa What should I tell my health care provider before I take this medicine? They need to know if you have any of these conditions: -bleeding disorders -history of stomach bleeding -mechanical heart valve -kidney disease -recent or planned spinal or epidural procedure -an allergic reaction to dabigatran, other medicines, foods, dyes, or preservatives -pregnant or trying to get pregnant -breast-feeding How should I use this medicine? Take this medicine by mouth with a full glass of water. Follow the directions on the prescription label. Do not break, chew, or empty the contents from the capsule. Opening the capsule can increase your dose, which increases your risk of bleeding. You can take it with or without food. If it upsets your stomach, take it with food. Take your medicine at regular intervals. Do not take it more often than directed. Do not stop taking except on your doctor's advice. Stopping this medicine may increase your risk of a blot clot. Be sure to refill your prescription before you run out of medicine. A special MedGuide will be given to you by the pharmacist with each prescription and refill. Be sure to read this information carefully each time. Talk to your pediatrician regarding the use of this medicine in children. Special care may be needed. Overdosage: If you think you have taken too much of this medicine contact a poison control center or emergency room at once. NOTE: This medicine is only for you. Do not share this medicine with others. What if I  miss a dose? If you miss a dose, take it as soon as you can. If your next dose is less than 6 hours away, skip the missed dose. Do not take double or extra doses. What may interact with this medicine? Do not take this medicine with any of the following medications: -certain medicines that treat or prevent blood clots like warfarin, enoxaparin, and dalteparin -mifepristone, RU-486 This medicine may also interact with the following: -carbamazepine -certain medicines for depression -dronedarone -ketoconazole -NSAIDs, medicines for pain and inflammation, like ibuprofen or naproxen -quinidine -rifampin -tipranavir This list may not describe all possible interactions. Give your health care provider a list of all the medicines, herbs, non-prescription drugs, or dietary supplements you use. Also tell them if you smoke, drink alcohol, or use illegal drugs. Some items may interact with your medicine. What should I watch for while using this medicine? Visit your doctor or health care professional for regular checks on your progress. Notify your doctor or health care professional and seek emergency treatment if you develop breathing problems; changes in vision; chest pain; severe, sudden headache; pain, swelling, warmth in the leg; trouble speaking; sudden numbness or weakness of the face, arm or leg. These can be signs that your condition has gotten worse. If you are going to have surgery or other procedure, tell your doctor that you are taking this medicine. What side effects may I notice from receiving this medicine? Side effects that you should report to your doctor or health care professional as soon as possible: -allergic reactions like skin rash, itching or hives, swelling of   the face, lips, or tongue -feeling faint or lightheaded, falls -signs and symptoms of bleeding such as bloody or black, tarry stools; red or dark-brown urine; spitting up blood or brown material that looks like coffee  grounds; red spots on the skin; unusual bruising or bleeding from the eye, gums, or nose Side effects that usually do not require medical attention (report to your doctor or health care professional if they continue or are bothersome): -stomach pain -upset stomach This list may not describe all possible side effects. Call your doctor for medical advice about side effects. You may report side effects to FDA at 1-800-FDA-1088. Where should I keep my medicine? Keep out of the reach of children. Store between 20 and 25 degrees C (68 and 77 degrees F). Keep this medicine in the original container. Throw away any unused medicine after 4 months. NOTE: This sheet is a summary. It may not cover all possible information. If you have questions about this medicine, talk to your doctor, pharmacist, or health care provider.  2018 Elsevier/Gold Standard (2016-12-06 10:55:47)  

## 2017-05-05 NOTE — Progress Notes (Signed)
Patient refuses wheelchair. RN assisting patient to his wife's car

## 2017-05-05 NOTE — Care Management Note (Signed)
Case Management Note  Patient Details  Name: Gerald Baker MRN: [id number] Date of Birth: 12/18/1951  Subjective/Objective:    Pt admitted to r/o CVA. He is from home with his wife.                 Action/Plan: No f/u per PT/OT and no DME needs. Pt discharging on Pradaxa. CM met with the patient and his wife to provide him a coupon for Pradaxa. Pt had already gone to Pradaxa site and registered for the savings card.  CM called patients pharmacy and they do have a 30 day supply of the Pradaxa.  Wife to provide tranport home.   Expected Discharge Date:  05/05/17               Expected Discharge Plan:  Home/Self Care  In-House Referral:     Discharge planning Services  CM Consult  Post Acute Care Choice:    Choice offered to:     DME Arranged:    DME Agency:     HH Arranged:    HH Agency:     Status of Service:  Completed, signed off  If discussed at [employer] of Stay Meetings, dates discussed:    Additional Comments:  Pollie Friar, RN 05/05/2017, 8:24 PM

## 2017-05-05 NOTE — Discharge Summary (Signed)
Physician Discharge Summary  ANIS DEGIDIO ZOX:[id number] DOB: 1951/11/13 DOA: 05/03/2017  PCP: Kirby Funk, MD  Admit date: 05/03/2017 Discharge date: 05/05/2017  Recommendations for Outpatient Follow-up:  Per neuro continue pradaxa for secondary stroke prevention Follow up with neuro and cardio per sch appt  Discharge Diagnoses:  Principal Problem:   Suspected stroke patient last known to be well more than 2 hours ago Active Problems:   HTN (hypertension)   Diabetes mellitus (HCC)   Paroxysmal atrial fibrillation (HCC)   Expressive aphasia    Discharge Condition: stable   Diet recommendation: as tolerated   History of present illness:  65 year old male with atrial fibrillation on xarelto who presented to [facility] with aphasia the night prior to the admission. CT head showed no acute findings. CT angiogram showed possible dissection. MRI did not show acute stroke.   Hospital Course:   Principal Problem:  Expressive aphasia Stroke work up initiated:  - Per neuro continue pradaxa on discharge for secondary stroke prevention 150 mg BID - CT head - no acute findings  - Ct Angio Head and neck- 1. 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 There is severe stenosis just beyond the dura, flow to the left PICA is likely retrograde. 2. Very mild atheromatous changes in the cervical carotids. No stenosis or branch occlusion in the anterior circulation.  - MRI/MRA -1. Crescentic increased T2 signal is present within the left V4 segment proximal to left PICA origin compatible with dissection as seen on CT angiogram. 2. Gradient diminished flow related signal within the left V4 segment from left vertebrobasilar junction to PICA origin indicating reversal of flow. 3. Otherwise patent circle of Willis. No additional evidence for large vessel occlusion, aneurysm, or significant stenosis is identified. 4. No evidence of acute infarction, hemorrhage, or  focal mass effect of the brain. 5. Mild chronic microvascular ischemic changes of the brain. - 2D ECHO - EF 55% and grade 1 DD   - HgbA1c 7.2 indicating good glycemic control, Lipid panel - LDL 79. LDL goal < 100 - Diet: regular  - Therapy: PT/OT - no PT follow up required   Active Problems:   HTN (hypertension), essential - Resume home meds     Paroxysmal atrial fibrillation (HCC) - CHADS vasc score at least 3 - Will be on pradaxa per neuro  - Resume metoprolol and flecainide     Diabetes mellitus without complications - Resume metformin on discharge as per prior to the admission    DVT prophylaxis: pradaxa on discharge  Code Status: full code  Family Communication: wife at bedside     Consultants:   Neurology   Procedures:   ECHO -  EF 55%, grade 1 DD  Antimicrobials:   None   Signed:  Manson Passey, MD  [facility] 05/05/2017, 3:58 PM  Pager #: [phone]  Time spent in minutes: less than 30 minutes    Discharge Exam: Vitals:   05/05/17 0939 05/05/17 1402  BP: (!) 151/82 124/82  Pulse: 76 71  Resp: 20   Temp: 98.2 F (36.8 C) 98.6 F (37 C)   Vitals:   05/05/17 0100 05/05/17 0439 05/05/17 0939 05/05/17 1402  BP: (!) 113/56 139/80 (!) 151/82 124/82  Pulse: 65 77 76 71  Resp: 18 17 20    Temp: 98.4 F (36.9 C) 98.7 F (37.1 C) 98.2 F (36.8 C) 98.6 F (37 C)  TempSrc: Oral Oral Oral Oral  SpO2: 95% 98% 97%  95%  Weight:      Height:        General: Pt is alert, follows commands appropriately, not in acute distress Cardiovascular: Regular rate and rhythm, S1/S2 +, no murmurs Respiratory: Clear to auscultation bilaterally, no wheezing, no crackles, no rhonchi Abdominal: Soft, non tender, non distended, bowel sounds +, no guarding Extremities: no edema, no cyanosis, pulses palpable bilaterally DP and PT Neuro: Grossly nonfocal  Discharge Instructions  Discharge Instructions    Call MD for:  persistant nausea and vomiting     Complete by:  As directed    Call MD for:  redness, tenderness, or signs of infection (pain, swelling, redness, odor or green/yellow discharge around incision site)    Complete by:  As directed    Call MD for:  severe uncontrolled pain    Complete by:  As directed    Diet - low sodium heart healthy    Complete by:  As directed    Discharge instructions    Complete by:  As directed    Continue pradaxa 150 mg twice a day   Increase activity slowly    Complete by:  As directed      Allergies as of 05/05/2017      Reactions   Altace [ramipril] Cough   Cozaar [losartan Potassium] Other (See Comments)   Leg pain   Penicillins Hives, Rash   Has patient had a PCN reaction causing immediate rash, facial/tongue/throat swelling, SOB or lightheadedness with hypotension: Yes Has patient had a PCN reaction causing severe rash involving mucus membranes or skin necrosis: No Has patient had a PCN reaction that required hospitalization No Has patient had a PCN reaction occurring within the last 10 years: No If all of the above answers are "NO", then may proceed with Cephalosporin use.   Sulfa Antibiotics Rash   Allergy from childhood   Sulfur Rash   Might have meant (Sulfa??)      Medication List    STOP taking these medications   XARELTO 20 MG Tabs tablet Generic drug:  rivaroxaban     TAKE these medications   dabigatran 150 MG Caps capsule Commonly known as:  PRADAXA Take 1 capsule (150 mg total) by mouth 2 (two) times daily.   flecainide 150 MG tablet Commonly known as:  TAMBOCOR Take 2 tablets (300mg ) by mouth once may repeat in 36 hours for afib. What changed:  how much to take  how to take this  when to take this  reasons to take this  additional instructions   metFORMIN 500 MG 24 hr tablet Commonly known as:  GLUCOPHAGE-XR Take 1,000 mg by mouth 2 (two) times daily.   metoprolol tartrate 100 MG tablet Commonly known as:  LOPRESSOR Take 1 tablet (100 mg total)  by mouth 2 (two) times daily.      Follow-up Information    Kirby Funk, MD. Schedule an appointment as soon as possible for a visit in 1 week(s).   Specialty:  Internal Medicine Contact information: [address] [place] [address] [phone]            The results of significant diagnostics from this hospitalization (including imaging, microbiology, ancillary and laboratory) are listed below for reference.    Significant Diagnostic Studies: Ct Angio Head W Or Wo Contrast  Result Date: 05/03/2017 CLINICAL DATA:  Stroke and aphasia. EXAM: CT ANGIOGRAPHY HEAD AND NECK TECHNIQUE: Multidetector CT imaging of the head and neck was performed using the standard protocol  during bolus administration of intravenous contrast. Multiplanar CT image reconstructions and MIPs were obtained to evaluate the vascular anatomy. Carotid stenosis measurements (when applicable) are obtained utilizing NASCET criteria, using the distal internal carotid diameter as the denominator. CONTRAST:  50 cc Isovue 370 intravenous COMPARISON:  Noncontrast head CT from earlier today FINDINGS: CTA NECK FINDINGS Aortic arch: 3 vessel branching.  No abnormal finding Right carotid system: Small volume atherosclerotic plaque at the carotid bulb. No stenosis, ulceration, or dissection. ICA tortuosity before the skullbase. Left carotid system: Mild atherosclerotic plaque at the ICA bulb. No stenosis, ulceration, or dissection. ICA tortuosity before the skullbase. Vertebral arteries: No proximal subclavian stenosis. Right vertebral dominance, smooth and widely patent to the dura. Subclavian and vertebral origin plaque with moderate stenosis. There is irregular narrowing of the left distal V2 and V3 segments with faint flow via the level of the dura. The distal V4 segment sizable. Skeleton: No acute or aggressive finding Other neck: No evidence of inflammation. Prominent lingual tonsil, partially effacing the  vallecula. Upper chest: Airway thickening at the apices. Review of the MIP images confirms the above findings CTA HEAD FINDINGS Anterior circulation: Smooth and widely patent carotid siphons. No major branch occlusion, beading, or stenosis. Negative for aneurysm. Posterior circulation: Asymmetric poor flow in the left vertebral artery with severe proximal V4 segment stenosis. Left PICA is patent. The basilar is smooth and widely patent, as are the bilateral PCA. Venous sinuses: Patent Anatomic variants: None significant Delayed phase: No abnormal intracranial enhancement. These results were called by telephone at the time of interpretation on 05/03/2017 at 9:36 pm to Dr. Ritta Slot , who verbally acknowledged these results. Review of the MIP images confirms the above findings IMPRESSION: 1. 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 There is severe stenosis just beyond the dura, flow to the left PICA is likely retrograde. 2. Very mild atheromatous changes in the cervical carotids. No stenosis or branch occlusion in the anterior circulation. Electronically Signed   By: Marnee Spring M.D.   On: 05/03/2017 21:36   Ct Angio Neck W Or Wo Contrast  Result Date: 05/03/2017 CLINICAL DATA:  Stroke and aphasia. EXAM: CT ANGIOGRAPHY HEAD AND NECK TECHNIQUE: Multidetector CT imaging of the head and neck was performed using the standard protocol during bolus administration of intravenous contrast. Multiplanar CT image reconstructions and MIPs were obtained to evaluate the vascular anatomy. Carotid stenosis measurements (when applicable) are obtained utilizing NASCET criteria, using the distal internal carotid diameter as the denominator. CONTRAST:  50 cc Isovue 370 intravenous COMPARISON:  Noncontrast head CT from earlier today FINDINGS: CTA NECK FINDINGS Aortic arch: 3 vessel branching.  No abnormal finding Right carotid system: Small volume atherosclerotic plaque at  the carotid bulb. No stenosis, ulceration, or dissection. ICA tortuosity before the skullbase. Left carotid system: Mild atherosclerotic plaque at the ICA bulb. No stenosis, ulceration, or dissection. ICA tortuosity before the skullbase. Vertebral arteries: No proximal subclavian stenosis. Right vertebral dominance, smooth and widely patent to the dura. Subclavian and vertebral origin plaque with moderate stenosis. There is irregular narrowing of the left distal V2 and V3 segments with faint flow via the level of the dura. The distal V4 segment sizable. Skeleton: No acute or aggressive finding Other neck: No evidence of inflammation. Prominent lingual tonsil, partially effacing the vallecula. Upper chest: Airway thickening at the apices. Review of the MIP images confirms the above findings CTA HEAD FINDINGS Anterior circulation: Smooth and widely patent carotid  siphons. No major branch occlusion, beading, or stenosis. Negative for aneurysm. Posterior circulation: Asymmetric poor flow in the left vertebral artery with severe proximal V4 segment stenosis. Left PICA is patent. The basilar is smooth and widely patent, as are the bilateral PCA. Venous sinuses: Patent Anatomic variants: None significant Delayed phase: No abnormal intracranial enhancement. These results were called by telephone at the time of interpretation on 05/03/2017 at 9:36 pm to Dr. Ritta SlotMCNEILL KIRKPATRICK , who verbally acknowledged these results. Review of the MIP images confirms the above findings IMPRESSION: 1. 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 There is severe stenosis just beyond the dura, flow to the left PICA is likely retrograde. 2. Very mild atheromatous changes in the cervical carotids. No stenosis or branch occlusion in the anterior circulation. Electronically Signed   By: Marnee SpringJonathon  Watts M.D.   On: 05/03/2017 21:36   Mr Brain Wo Contrast  Result Date: 05/04/2017 CLINICAL DATA:  66 y/o   M; expressive aphasia. EXAM: MRI HEAD WITHOUT CONTRAST MRA HEAD WITHOUT CONTRAST TECHNIQUE: Multiplanar, multiecho pulse sequences of the brain and surrounding structures were obtained without intravenous contrast. Angiographic images of the head were obtained using MRA technique without contrast. COMPARISON:  05/03/2017 CT and CTA of the head. FINDINGS: MRI HEAD FINDINGS Brain: No acute infarction, hemorrhage, hydrocephalus, extra-axial collection or mass lesion. Few nonspecific foci of T2 FLAIR hyperintense signal abnormality in subcortical and periventricular white matter are consistent with mild chronic microvascular ischemic changes. Vascular: Crescentic increased T2 signal is present within the left V4 segment proximal to the left PICA origin (series 6, image 4). Skull and upper cervical spine: Normal marrow signal. Sinuses/Orbits: Negative. Other: None. MRA HEAD FINDINGS Internal carotid arteries:  Patent. Anterior cerebral arteries:  Patent. Middle cerebral arteries: Patent. Anterior communicating artery: Patent. Posterior communicating arteries: Patent left and probable diminutive right posterior communicating arteries. Posterior cerebral arteries:  Patent. Basilar artery:  Patent. Vertebral arteries: Crescentic increased T2 signal within the wall of the left V4 segment proximal to the left PICA origin is compatible with dissection. Flow related signal within the left V4 segment between the left PICA origin and vertebrobasilar junction is decreased in signal in comparison with the contralateral right vertebral artery likely due to reversal of flow into the left vertebral artery from the right vertebral artery and basilar artery. No evidence of high-grade stenosis, large vessel occlusion, or aneurysm unless noted above. IMPRESSION: 1. Crescentic increased T2 signal is present within the left V4 segment proximal to left PICA origin compatible with dissection as seen on CT angiogram. 2. Gradient diminished  flow related signal within the left V4 segment from left vertebrobasilar junction to PICA origin indicating reversal of flow. 3. Otherwise patent circle of Willis. No additional evidence for large vessel occlusion, aneurysm, or significant stenosis is identified. 4. No evidence of acute infarction, hemorrhage, or focal mass effect of the brain. 5. Mild chronic microvascular ischemic changes of the brain. Electronically Signed   By: Mitzi HansenLance  Furusawa-Stratton M.D.   On: 05/04/2017 00:05   Dg Chest Port 1 View  Result Date: 05/03/2017 CLINICAL DATA:  Acute fatigue and weakness. EXAM: PORTABLE CHEST 1 VIEW COMPARISON:  08/03/2009 and prior chest radiographs FINDINGS: The cardiomediastinal silhouette is unremarkable. There is no evidence of focal airspace disease, pulmonary edema, suspicious pulmonary nodule/mass, pleural effusion, or pneumothorax. No acute bony abnormalities are identified. IMPRESSION: No active disease. Electronically Signed   By: Harmon PierJeffrey  Hu M.D.   On: 05/03/2017 20:12  Mr Maxine Glenn Head/brain ZO Cm  Result Date: 05/04/2017 CLINICAL DATA:  66 y/o  M; expressive aphasia. EXAM: MRI HEAD WITHOUT CONTRAST MRA HEAD WITHOUT CONTRAST TECHNIQUE: Multiplanar, multiecho pulse sequences of the brain and surrounding structures were obtained without intravenous contrast. Angiographic images of the head were obtained using MRA technique without contrast. COMPARISON:  05/03/2017 CT and CTA of the head. FINDINGS: MRI HEAD FINDINGS Brain: No acute infarction, hemorrhage, hydrocephalus, extra-axial collection or mass lesion. Few nonspecific foci of T2 FLAIR hyperintense signal abnormality in subcortical and periventricular white matter are consistent with mild chronic microvascular ischemic changes. Vascular: Crescentic increased T2 signal is present within the left V4 segment proximal to the left PICA origin (series 6, image 4). Skull and upper cervical spine: Normal marrow signal. Sinuses/Orbits: Negative. Other:  None. MRA HEAD FINDINGS Internal carotid arteries:  Patent. Anterior cerebral arteries:  Patent. Middle cerebral arteries: Patent. Anterior communicating artery: Patent. Posterior communicating arteries: Patent left and probable diminutive right posterior communicating arteries. Posterior cerebral arteries:  Patent. Basilar artery:  Patent. Vertebral arteries: Crescentic increased T2 signal within the wall of the left V4 segment proximal to the left PICA origin is compatible with dissection. Flow related signal within the left V4 segment between the left PICA origin and vertebrobasilar junction is decreased in signal in comparison with the contralateral right vertebral artery likely due to reversal of flow into the left vertebral artery from the right vertebral artery and basilar artery. No evidence of high-grade stenosis, large vessel occlusion, or aneurysm unless noted above. IMPRESSION: 1. Crescentic increased T2 signal is present within the left V4 segment proximal to left PICA origin compatible with dissection as seen on CT angiogram. 2. Gradient diminished flow related signal within the left V4 segment from left vertebrobasilar junction to PICA origin indicating reversal of flow. 3. Otherwise patent circle of Willis. No additional evidence for large vessel occlusion, aneurysm, or significant stenosis is identified. 4. No evidence of acute infarction, hemorrhage, or focal mass effect of the brain. 5. Mild chronic microvascular ischemic changes of the brain. Electronically Signed   By: Mitzi Hansen M.D.   On: 05/04/2017 00:05   Ct Head Code Stroke W/o Cm  Result Date: 05/03/2017 CLINICAL DATA:  Code stroke.  Aphasia.  Sudden onset confusion. EXAM: CT HEAD WITHOUT CONTRAST TECHNIQUE: Contiguous axial images were obtained from the base of the skull through the vertex without intravenous contrast. COMPARISON:  None. FINDINGS: Brain: No evidence of acute infarction, hemorrhage, hydrocephalus. Small  incidental arachnoid cyst in the medial left temporal fossa measuring 20 x 10 mm. Vascular: No hyperdense vessel. Scattered atherosclerotic calcification. Skull: Negative Sinuses/Orbits: Negative Other: Text page with results sent on 05/03/2017 at 7:32 pm to Dr. Amada Jupiter ASPECTS [facility] Stroke Program Early CT Score) - Ganglionic level infarction (caudate, lentiform nuclei, internal capsule, insula, M1-M3 cortex): 7 - Supraganglionic infarction (M4-M6 cortex): 3 Total score (0-10 with 10 being normal): 10 IMPRESSION: No acute finding.  ASPECTS is 10. Electronically Signed   By: Marnee Spring M.D.   On: 05/03/2017 19:33    Microbiology: No results found for this or any previous visit (from the past 240 hour(s)).   Labs: Basic Metabolic Panel:  Recent Labs Lab 05/03/17 1925 05/03/17 1936  NA 135 137  K 4.1 4.1  CL 101 101  CO2 25  --   GLUCOSE 139* 140*  BUN 13 14  CREATININE 0.86 0.80  CALCIUM 8.9  --    Liver Function Tests:  Recent Labs Lab 05/03/17  1925  AST 27  ALT 30  ALKPHOS 55  BILITOT 0.5  PROT 6.7  ALBUMIN 3.6   No results for input(s): LIPASE, AMYLASE in the last 168 hours. No results for input(s): AMMONIA in the last 168 hours. CBC:  Recent Labs Lab 05/03/17 1925 05/03/17 1936  WBC 9.4  --   NEUTROABS 5.6  --   HGB 14.4 15.3  HCT 42.7 45.0  MCV 89.7  --   PLT 234  --    Cardiac Enzymes: No results for input(s): CKTOTAL, CKMB, CKMBINDEX, TROPONINI in the last 168 hours. BNP: BNP (last 3 results) No results for input(s): BNP in the last 8760 hours.  ProBNP (last 3 results) No results for input(s): PROBNP in the last 8760 hours.  CBG:  Recent Labs Lab 05/04/17 1220 05/04/17 1718 05/04/17 2108 05/05/17 0607 05/05/17 1155  GLUCAP 199* 155* 115* 148* 133*

## 2017-05-05 NOTE — Progress Notes (Signed)
Pt discharge education and instructions completed with pt and spouse at bedside; both voices understanding and denies any questions. Pt IV and telemetry removed; pt handed his prescription for pradaxa. Pt discharge home with spouse to transport him home. Pt transported off unit via wheelchair with belongings and spouse to the side. Dionne BucyP. Amo Marvina Danner RN

## 2017-05-20 ENCOUNTER — Telehealth: Payer: Self-pay | Admitting: Neurology

## 2017-05-20 NOTE — Telephone Encounter (Signed)
Patient was prescribed dabigatran (PRADAXA) 150 MG CAPS capsule in the hospital by Dr. Pearlean BrownieSethi on 05-05-17. The patient has a peridontal teeth cleaning on 05-22-17 and wants to know if he should stop taking this medication before the cleaning. His dentist advised him to call our office.

## 2017-05-20 NOTE — Telephone Encounter (Signed)
Revised. 

## 2017-05-20 NOTE — Telephone Encounter (Addendum)
Rn call patient about his pradaxa medication. Patient stated he is schedule to have dental cleaning on 05/22/2017.Pt was just discharge on 05/05/2017 from the hospital. PT stated he was never told to follow up with Dr. Pearlean BrownieSethi. He thought someone told him 6 months. Rn stated typically patients follow up within 6 to 8 weeks depending on MD schedule. Rn stated per Dr. Roda ShuttersXu stroke doctor he needs to cancel  his dental cleaning appt. He is at high risk of having another possible stroke. He does not need to be off his medications at this time. RN stated he will need to wait until he sees Dr. Pearlean BrownieSethi for an appt. Rn stated per Dr. Roda ShuttersXu he is high risk. Pt schedule appt with Dr Pearlean BrownieSethi on July 22, 2017. Pt will be seeing his PCP, and cardiologist. In the next two months. Pt verbalized understanding.

## 2017-06-11 ENCOUNTER — Encounter: Payer: Self-pay | Admitting: Interventional Cardiology

## 2017-06-26 NOTE — Progress Notes (Signed)
Cardiology Office Note    Date:  06/27/2017   ID:  Gerald Baker, Gerald Baker January 23, 1951, MRN [id number]  PCP:  Kirby Funk, MD  Cardiologist: Lesleigh Noe, MD   Chief Complaint  Patient presents with  . Atrial Fibrillation  . Cerebrovascular Accident    History of Present Illness:  Gerald Baker is a 66 y.o. male who presents for follow-up of paroxysmal atrial fibrillation, hypertension, chronic anticoagulation therapy, obesity, and suspected obstructive sleep apnea.Recent neurological event on anticoagulation therapy with Xarelto.  On 05/03/2017 the patient experiencing difficulty speaking, went to the hospital, and was ultimately diagnosed with an embolic CVA/TIA. He is nearly fully recovered. He otherwise is done well. Xarelto was switched to Pradaxa. No bleeding complications. Back to full physical activity. Saw Dr. Pearlean Brownie of the stroke team acutely. Has follow-up scheduled. Seen acutely by Dr. Onalee Hua and felt to have an embolic left temporal lobe of the event based upon clinical exam.  The patient feels event was not atrial fibrillated because he has had no significant recent episodes. He has had perhaps 3 over the past 12 months that have responded promptly to "Pill in the pocket" with flecainide.  Past Medical History:  Diagnosis Date  . Diabetes mellitus without complication (HCC)   . Hypertension   . Persistent atrial fibrillation (HCC)    chads2vasc is 2.  on xarelto    Past Surgical History:  Procedure Laterality Date  . CARDIOVERSION N/A 10/19/2015   Procedure: CARDIOVERSION;  Surgeon: Chilton Si, MD;  Location: [facility] ENDOSCOPY;  Service: Cardiovascular;  Laterality: N/A;  . KNEE ARTHROSCOPY      Current Medications: Outpatient Medications Prior to Visit  Medication Sig Dispense Refill  . dabigatran (PRADAXA) 150 MG CAPS capsule Take 1 capsule (150 mg total) by mouth 2 (two) times daily. 60 capsule 0  . flecainide (TAMBOCOR) 150 MG tablet Take 2  tablets (300mg ) by mouth once may repeat in 36 hours for afib. (Patient taking differently: Take 450 mg by mouth once as needed (for A-FIB). And may repeat once in 36 hours for afib (if no results)) 12 tablet 0  . metFORMIN (GLUCOPHAGE-XR) 500 MG 24 hr tablet Take 1,000 mg by mouth 2 (two) times daily.    . metoprolol (LOPRESSOR) 100 MG tablet Take 1 tablet (100 mg total) by mouth 2 (two) times daily.     No facility-administered medications prior to visit.      Allergies:   Altace [ramipril]; Cozaar [losartan potassium]; Penicillins; Sulfa antibiotics; and Sulfur   Social History   Social History  . Marital status: Married    Spouse name: N/A  . Number of children: N/A  . Years of education: N/A   Social History Main Topics  . Smoking status: Former Games developer  . Smokeless tobacco: Never Used     Comment: Quit 1997  . Alcohol use 0.0 oz/week     Comment: daily  . Drug use: No  . Sexual activity: Not Asked   Other Topics Concern  . None   Social History Narrative  . None     Family History:  The patient's family history includes Heart disease in his father; Heart failure in his father.   ROS:   Please see the history of present illness.    This totally had mild sleep apnea but refuses therapy. He is accompanied by his wife he does notice occasional irregular heartbeat, but otherwise no complaint.  All other systems reviewed and are negative.  PHYSICAL EXAM:   VS:  BP 134/88 (BP Location: Left Arm)   Pulse 74   Ht 6' (1.829 m)   Wt 282 lb (127.9 kg)   BMI 38.25 kg/m    GEN: Well nourished, well developed, in no acute distress . Marked obesity HEENT: normal  Neck: no JVD, carotid bruits, or masses Cardiac: RRR; no murmurs, rubs, or gallops,no edema  Respiratory:  clear to auscultation bilaterally, normal work of breathing GI: soft, nontender, nondistended, + BS MS: no deformity or atrophy  Skin: warm and dry, no rash Neuro:  Alert and Oriented x 3, Strength and  sensation are intact Psych: euthymic mood, full affect  Wt Readings from Last 3 Encounters:  06/27/17 282 lb (127.9 kg)  05/04/17 280 lb (127 kg)  11/03/16 280 lb (127 kg)      Studies/Labs Reviewed:   EKG:  EKG  Not performed for today's visit. All EKGs done during the hospital stay were reviewed. Normal sinus rhythm was noted. No acute ST-T wave change was seen.  Recent Labs: 05/03/2017: ALT 30; BUN 14; Creatinine, Ser 0.80; Hemoglobin 15.3; Platelets 234; Potassium 4.1; Sodium 137   Lipid Panel    Component Value Date/Time   CHOL 148 05/04/2017 0528   TRIG 126 05/04/2017 0528   HDL 44 05/04/2017 0528   CHOLHDL 3.4 05/04/2017 0528   VLDL 25 05/04/2017 0528   LDLCALC 79 05/04/2017 0528    Additional studies/ records that were reviewed today include:  Echocardiogram 05/05/17:  Study Conclusions  - Left ventricle: The cavity size was normal. There was mild   concentric hypertrophy. Systolic function was normal. The   estimated ejection fraction was in the range of 55% to 60%. Wall   motion was normal; there were no regional wall motion   abnormalities. Doppler parameters are consistent with abnormal   left ventricular relaxation (grade 1 diastolic dysfunction).  Impressions:  - No cardiac source of emboli was indentified. Compared to the   prior study, there has been no significant interval change.  MRI/MRA 05/03/17: IMPRESSION: 1. Crescentic increased T2 signal is present within the left V4 segment proximal to left PICA origin compatible with dissection as seen on CT angiogram. 2. Gradient diminished flow related signal within the left V4 segment from left vertebrobasilar junction to PICA origin indicating reversal of flow. 3. Otherwise patent circle of Willis. No additional evidence for large vessel occlusion, aneurysm, or significant stenosis is identified. 4. No evidence of acute infarction, hemorrhage, or focal mass effect of the brain. 5. Mild chronic  microvascular ischemic changes of the brain.   ASSESSMENT:    1. Paroxysmal atrial fibrillation (HCC)   2. Essential hypertension   3. Chronic diastolic heart failure (HCC)   4. Type 2 diabetes mellitus with complication, without long-term current use of insulin (HCC)   5. Dyslipidemia   6. Chronic anticoagulation      PLAN:  In order of problems listed above:  1. Currently in sinus rhythm. No change in management strategy. Agree with switch to Pradaxa. 2. Target blood pressure less than 140/90 mmHg. Discussed with the patient. He will continue to monitor. 3. No evidence of volume overload. 4. Poor control with hemoglobin A1c greater than 7 during hospital stay 5. LDL cholesterol should be less than 70. He was 78 when checked during the hospital stay. 6. Continue Pradaxa as noted.  Overall plan is aggressive risk factor modification. Stay with the current anticoagulation regimen. Clinical follow-up in 6-9 months. Notify us if  recurrent neurological complaints.    Medication Adjustments/Labs and Tests Ordered: Current medicines are reviewed at length with the patient today.  Concerns regarding medicines are outlined above.  Medication changes, Labs and Tests ordered today are listed in the Patient Instructions below. Patient Instructions  Medication Instructions:  None  Labwork: None  Testing/Procedures: None  Follow-Up: Your physician wants you to follow-up in: 9-12 months with Dr. Katrinka Blazing.  You will receive a reminder letter in the mail two months in advance. If you don't receive a letter, please call our office to schedule the follow-up appointment.   Any Other Special Instructions Will Be Listed Below (If Applicable).     If you need a refill on your cardiac medications before your next appointment, please call your pharmacy.      Signed, Lesleigh Noe, MD  06/27/2017 4:28 PM    [facility] [address] [place], [place], [place]   [address] Phone: [phone]; Fax: [phone]

## 2017-06-27 ENCOUNTER — Encounter: Payer: Self-pay | Admitting: Interventional Cardiology

## 2017-06-27 ENCOUNTER — Ambulatory Visit (INDEPENDENT_AMBULATORY_CARE_PROVIDER_SITE_OTHER): Payer: BLUE CROSS/BLUE SHIELD | Admitting: Interventional Cardiology

## 2017-06-27 VITALS — BP 134/88 | HR 74 | Ht 72.0 in | Wt 282.0 lb

## 2017-06-27 DIAGNOSIS — I10 Essential (primary) hypertension: Secondary | ICD-10-CM

## 2017-06-27 DIAGNOSIS — I480 Paroxysmal atrial fibrillation: Secondary | ICD-10-CM

## 2017-06-27 DIAGNOSIS — Z7901 Long term (current) use of anticoagulants: Secondary | ICD-10-CM | POA: Diagnosis not present

## 2017-06-27 DIAGNOSIS — E118 Type 2 diabetes mellitus with unspecified complications: Secondary | ICD-10-CM

## 2017-06-27 DIAGNOSIS — I5032 Chronic diastolic (congestive) heart failure: Secondary | ICD-10-CM | POA: Diagnosis not present

## 2017-06-27 DIAGNOSIS — E785 Hyperlipidemia, unspecified: Secondary | ICD-10-CM

## 2017-06-27 NOTE — Patient Instructions (Signed)
Medication Instructions:  None  Labwork: None  Testing/Procedures: None  Follow-Up: Your physician wants you to follow-up in: 9-12 months with Dr. Smith.  You will receive a reminder letter in the mail two months in advance. If you don't receive a letter, please call our office to schedule the follow-up appointment.   Any Other Special Instructions Will Be Listed Below (If Applicable).     If you need a refill on your cardiac medications before your next appointment, please call your pharmacy.   

## 2017-07-22 ENCOUNTER — Ambulatory Visit (INDEPENDENT_AMBULATORY_CARE_PROVIDER_SITE_OTHER): Payer: BLUE CROSS/BLUE SHIELD | Admitting: Neurology

## 2017-07-22 ENCOUNTER — Encounter: Payer: Self-pay | Admitting: Neurology

## 2017-07-22 VITALS — BP 158/93 | HR 65 | Ht 72.0 in | Wt 282.8 lb

## 2017-07-22 DIAGNOSIS — R4701 Aphasia: Secondary | ICD-10-CM

## 2017-07-22 NOTE — Patient Instructions (Signed)
I had a long d/w patient about his recent TIAe, risk for recurrent stroke/TIAs, personally independently reviewed imaging studies and stroke evaluation results and answered questions.Continue Pradaxa (dabigatran) twice a day  for secondary stroke prevention and maintain strict control of hypertension with blood pressure goal below 130/90, diabetes with hemoglobin A1c goal below 6.5% and lipids with LDL cholesterol goal below 70 mg/dL. I also advised the patient to eat a healthy diet with plenty of whole grains, cereals, fruits and vegetables, exercise regularly and maintain ideal body weight .he may also possibly consider participation in the PREMIERS stroke/TIA prevention trial if interested. Followup in the future with me in  6 months with my nurse practitioner call earlier if necessary  Stroke Prevention Some medical conditions and behaviors are associated with an increased chance of having a stroke. You may prevent a stroke by making healthy choices and managing medical conditions. How can I reduce my risk of having a stroke?  Stay physically active. Get at least 30 minutes of activity on most or all days.  Do not smoke. It may also be helpful to avoid exposure to secondhand smoke.  Limit alcohol use. Moderate alcohol use is considered to be: ? No more than 2 drinks per day for men. ? No more than 1 drink per day for nonpregnant women.  Eat healthy foods. This involves: ? Eating 5 or more servings of fruits and vegetables a day. ? Making dietary changes that address high blood pressure (hypertension), high cholesterol, diabetes, or obesity.  Manage your cholesterol levels. ? Making food choices that are high in fiber and low in saturated fat, trans fat, and cholesterol may control cholesterol levels. ? Take any prescribed medicines to control cholesterol as directed by your health care provider.  Manage your diabetes. ? Controlling your carbohydrate and sugar intake is recommended to  manage diabetes. ? Take any prescribed medicines to control diabetes as directed by your health care provider.  Control your hypertension. ? Making food choices that are low in salt (sodium), saturated fat, trans fat, and cholesterol is recommended to manage hypertension. ? Ask your health care provider if you need treatment to lower your blood pressure. Take any prescribed medicines to control hypertension as directed by your health care provider. ? If you are 2218-64 years of age, have your blood pressure checked every 3-5 years. If you are 64 years of age or older, have your blood pressure checked every year.  Maintain a healthy weight. ? Reducing calorie intake and making food choices that are low in sodium, saturated fat, trans fat, and cholesterol are recommended to manage weight.  Stop drug abuse.  Avoid taking birth control pills. ? Talk to your health care provider about the risks of taking birth control pills if you are over 64 years old, smoke, get migraines, or have ever had a blood clot.  Get evaluated for sleep disorders (sleep apnea). ? Talk to your health care provider about getting a sleep evaluation if you snore a lot or have excessive sleepiness.  Take medicines only as directed by your health care provider. ? For some people, aspirin or blood thinners (anticoagulants) are helpful in reducing the risk of forming abnormal blood clots that can lead to stroke. If you have the irregular heart rhythm of atrial fibrillation, you should be on a blood thinner unless there is a good reason you cannot take them. ? Understand all your medicine instructions.  Make sure that other conditions (such as anemia or atherosclerosis) are  addressed. Get help right away if:  You have sudden weakness or numbness of the face, arm, or leg, especially on one side of the body.  Your face or eyelid droops to one side.  You have sudden confusion.  You have trouble speaking (aphasia) or  understanding.  You have sudden trouble seeing in one or both eyes.  You have sudden trouble walking.  You have dizziness.  You have a loss of balance or coordination.  You have a sudden, severe headache with no known cause.  You have new chest pain or an irregular heartbeat. Any of these symptoms may represent a serious problem that is an emergency. Do not wait to see if the symptoms will go away. Get medical help at once. Call your local emergency services (911 in [place] Do not drive yourself to the hospital. This information is not intended to replace advice given to you by your health care provider. Make sure you discuss any questions you have with your health care provider. Document Released: 01/16/2005 Document Revised: 05/16/2016 Document Reviewed: 06/11/2013 Elsevier Interactive Patient Education  2017 [employer]

## 2017-07-22 NOTE — Progress Notes (Signed)
[facility] [address] [place] [id number] [phone]       OFFICE FOLLOW UP VISIT NOTE  Mr. Gerald BaconHarold P Baker Date of Birth:  1951/02/08 Medical Record Number:  [id number]      HPI:  Mr Gerald Baker is a [age] year pleasant Caucasian male seen today for first office follow-up visit following hospital admission for TIA in May 2018. History is updated the patient, review of electronic medical records and I have personally reviewed imaging films. He was in his normal state of health on 05/03/17 until sometime between 5 and 5:30 when he began having some difficulty with his speech. He states that it has been a static deficit since onset. He has been fatigued all day, but no acute mental status changes until 5 PM. LKW: 5 PM on 05/03/17 tpa given?: no, anticoagulated on Xarelto Premorbid modified rankin scale: 0  patient was admitted for TIA evaluation. MRI scan of the brain was obtained that showed no evidence of acute infarct. MRA of the brain showed possible dissection versus stenosis in the terminal left vertebral artery in the V4 segment. Patient denied any history of trauma to the neck sudden infant jerking of the neck. He however did admit that for a couple of months prior to the stroke he had been going to the gym and working out and lifting a lot of weight. Transfer to echo showed normal ejection fraction without cardiac source of embolism. LDL cholesterol was 79 mg percent. Hemoglobin A1c was 7.2. Patient was taking Xarelto and aspirin and after discussion wanted to switch from Xarelto to Pradaxa. He states his done well since discharge is not had any bleeding or bruising. He is tolerating healthy and is trying to lose weight. He states his sugars 7 all under good control. His blood pressure has been quite stable the today it is slightly elevated at 158/93. He is had no further episodes of speech disturbance or any new or recurrent stroke or TIA symptoms.  ROS:   14 system  review of systems is positive for no complaints today and all systems negative  PMH:  Past Medical History:  Diagnosis Date  . Diabetes mellitus without complication (HCC)   . Hypertension   . Persistent atrial fibrillation (HCC)    chads2vasc is 2.  on xarelto  . Stroke [facility] [place] [facility](HCC)     Social History:  Social History   Social History  . Marital status: Married    Spouse name: N/A  . Number of children: N/A  . Years of education: N/A   Occupational History  . Not on file.   Social History Main Topics  . Smoking status: Former Games developermoker  . Smokeless tobacco: Never Used     Comment: Quit 1997  . Alcohol use 1.8 oz/week    1 Glasses of wine, 1 Cans of beer, 1 Shots of liquor per week     Comment: daily,limited socially  . Drug use: No  . Sexual activity: Not on file   Other Topics Concern  . Not on file   Social History Narrative  . No narrative on file    Medications:   Current Outpatient Prescriptions on File Prior to Visit  Medication Sig Dispense Refill  . dabigatran (PRADAXA) 150 MG CAPS capsule Take 1 capsule (150 mg total) by mouth 2 (two) times daily. 60 capsule 0  . flecainide (TAMBOCOR) 150 MG tablet Take 2 tablets (300mg ) by mouth once may repeat in 36 hours for afib. (Patient taking differently:  Take 450 mg by mouth once as needed (for A-FIB). And may repeat once in 36 hours for afib (if no results)) 12 tablet 0  . metFORMIN (GLUCOPHAGE-XR) 500 MG 24 hr tablet Take 1,000 mg by mouth 2 (two) times daily.    . metoprolol (LOPRESSOR) 100 MG tablet Take 1 tablet (100 mg total) by mouth 2 (two) times daily.     No current facility-administered medications on file prior to visit.     Allergies:   Allergies  Allergen Reactions  . Altace [Ramipril] Cough  . Cozaar [Losartan Potassium] Other (See Comments)    Leg pain  . Penicillins Hives and Rash    Has patient had a PCN reaction causing immediate rash, facial/tongue/throat swelling, SOB or lightheadedness with  hypotension: Yes Has patient had a PCN reaction causing severe rash involving mucus membranes or skin necrosis: No Has patient had a PCN reaction that required hospitalization No Has patient had a PCN reaction occurring within the last 10 years: No If all of the above answers are "NO", then may proceed with Cephalosporin use.   . Sulfa Antibiotics Rash    Allergy from childhood  . Sulfur Rash    Might have meant (Sulfa??)    Physical Exam General: obese middle aged Caucasian male seated, in no evident distress Head: head normocephalic and atraumatic.   Neck: supple with no carotid or supraclavicular bruits Cardiovascular: regular rate and rhythm, no murmurs Musculoskeletal: no deformity Skin:  no rash/petichiae Vascular:  Normal pulses all extremities  Neurologic Exam Mental Status: Awake and fully alert. Oriented to place and time. Recent and remote memory intact. Attention span, concentration and fund of knowledge appropriate. Mood and affect appropriate.  Cranial Nerves: Fundoscopic exam reveals sharp disc margins. Pupils equal, briskly reactive to light. Extraocular movements full without nystagmus. Visual fields full to confrontation. Hearing intact. Facial sensation intact. Face, tongue, palate moves normally and symmetrically.  Motor: Normal bulk and tone. Normal strength in all tested extremity muscles. Sensory.: intact to touch , pinprick , position and vibratory sensation.  Coordination: Rapid alternating movements normal in all extremities. Finger-to-nose and heel-to-shin performed accurately bilaterally. Gait and Station: Arises from chair without difficulty. Stance is normal. Gait demonstrates normal stride length and balance . Able to heel, toe and tandem walk without difficulty.  Reflexes: 1+ and symmetric. Toes downgoing.   NIHSS  0 Modified Rankin  0   ASSESSMENT: 53 year Caucasian male with transient expressive aphasia due to left hemispheric TIA likely of cardiac  embolic etiology from atrial fibrillation in May 2018. 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    PLAN: I had a long d/w patient about his recent TIAe, risk for recurrent stroke/TIAs, personally independently reviewed imaging studies and stroke evaluation results and answered questions.Continue Pradaxa (dabigatran) twice a day  for secondary stroke prevention and maintain strict control of hypertension with blood pressure goal below 130/90, diabetes with hemoglobin A1c goal below 6.5% and lipids with LDL cholesterol goal below 70 mg/dL. I also advised the patient to eat a healthy diet with plenty of whole grains, cereals, fruits and vegetables, exercise regularly and maintain ideal body weight .he may also possibly consider participation in the PREMIERS stroke/TIA prevention trial if interested. Greater than 50% time during this 25 minute visit was spent on counseling and coordination of care about his recent TIA, discussion about atrial fibrillation treatment, vertebral artery dissection and answered questions Followup in the future with me in  6 months  with my nurse practitioner call earlier if necessary Delia HeadyPramod Andriana Casa, MD  [facility] [address] [place], [place] [id number]  Phone [phone] Fax [phone] Note: This document was prepared with digital dictation and possible smart phrase technology. Any transcriptional errors that result from this process are unintentional.

## 2017-12-05 ENCOUNTER — Other Ambulatory Visit (HOSPITAL_COMMUNITY): Payer: Self-pay | Admitting: Internal Medicine

## 2017-12-05 NOTE — Telephone Encounter (Signed)
That's fine

## 2017-12-05 NOTE — Telephone Encounter (Signed)
Patient was being seen by Rudi Cocoonna Carroll, but per last ov with her patient was to f/u prn there and continue to see Dr Katrinka BlazingSmith. Looks like the flecainide was prescribed once for onset of afib. Please advise on request. Thanks, MI

## 2018-01-28 NOTE — Progress Notes (Signed)
[facility]  PATIENT: Gerald Baker DOB: [birth year]   REASON FOR VISIT: Follow-up for history of TIA in May 2018 HISTORY FROM: Patient and wife    HISTORY OF PRESENT ILLNESS: 7/31/18PSMr Baker is a 30 year pleasant Caucasian male seen today for first office follow-up visit following hospital admission for TIA in May 2018. History is updated the patient, review of electronic medical records and I have personally reviewed imaging films.He was in his normal state of health on 05/03/17 until sometime between 5 and 5:30 when he began having some difficulty with his speech. He states that it has been a static deficit since onset. He has been fatigued all day, but no acute mental status changes until 5 PM. LKW: 5 PM on 05/03/17 tpa given?: no, anticoagulated on Xarelto Premorbid modified rankin scale: 0  patient was admitted for TIA evaluation. MRI scan of the brain was obtained that showed no evidence of acute infarct. MRA of the brain showed possible dissection versus stenosis in the terminal left vertebral artery in the V4 segment. Patient denied any history of trauma to the neck sudden infant jerking of the neck. He however did admit that for a couple of months prior to the stroke he had been going to the gym and working out and lifting a lot of weight. Transfer to echo showed normal ejection fraction without cardiac source of embolism. LDL cholesterol was 79 mg percent. Hemoglobin A1c was 7.2. Patient was taking Xarelto and aspirin and after discussion wanted to switch from Xarelto to Pradaxa. He states his done well since discharge is not had any bleeding or bruising. He is tolerating healthy and is trying to lose weight. He states his sugars 7 all under good control. His blood pressure has been quite stable the today it is slightly elevated at 158/93. He is had no further episodes of speech disturbance or any new or recurrent stroke or TIA symptoms UPDATE 2/7/2019[name],  67 year old male returns for follow-up with history of stroke TIA in May 2018.  He is currently on Xarelto for secondary stroke prevention and atrial fibrillation with minimal bruising and no bleeding.  He has not had further stroke or TIA symptoms.  Blood pressure in the office today 148/85 however patient says he takes his blood pressure at home and is generally within range.  He sees his primary care tomorrow for yearly physical.  He reports that his fasting blood sugars are generally 130 fasting.  He has gotten a little exercise this winter due to the weather.  He also has a problem with 1 of his knees but he is not going to have knee replacement until absolutely necessary.  He returns for reevaluation without new neurologic complaints. REVIEW OF SYSTEMS: Full 14 system review of systems performed and notable only for those listed, all others are neg:  Constitutional: neg  Cardiovascular: neg Ear/Nose/Throat: neg  Skin: neg Eyes: neg Respiratory: neg Gastroitestinal: neg  Hematology/Lymphatic: neg  Endocrine: neg Musculoskeletal: Knee joint pain Allergy/Immunology: neg Neurological: neg Psychiatric: neg Sleep : neg   ALLERGIES: Allergies  Allergen Reactions  . Altace [Ramipril] Cough  . Cozaar [Losartan Potassium] Other (See Comments)    Leg pain  . Penicillins Hives and Rash    Has patient had a PCN reaction causing immediate rash, facial/tongue/throat swelling, SOB or lightheadedness with hypotension: Yes Has patient had a PCN reaction causing severe rash involving mucus membranes or skin necrosis: No Has patient had a PCN reaction that required  hospitalization No Has patient had a PCN reaction occurring within the last 10 years: No If all of the above answers are "NO", then may proceed with Cephalosporin use.   . Sulfa Antibiotics Rash    Allergy from childhood  . Sulfur Rash    Might have meant (Sulfa??)    HOME MEDICATIONS: Outpatient Medications Prior to Visit    Medication Sig Dispense Refill  . flecainide (TAMBOCOR) 100 MG tablet TAKE 3 TABLETS (300MG )  BY MOUTH ONCE, MAY REPEAT IN 36 HOURS FOR AFIB 18 tablet 0  . metFORMIN (GLUCOPHAGE-XR) 500 MG 24 hr tablet Take 1,000 mg by mouth 2 (two) times daily.    . metoprolol (LOPRESSOR) 100 MG tablet Take 1 tablet (100 mg total) by mouth 2 (two) times daily.    Gerald Baker. XARELTO 20 MG TABS tablet 20 mg daily.     . dabigatran (PRADAXA) 150 MG CAPS capsule Take 1 capsule (150 mg total) by mouth 2 (two) times daily. (Patient not taking: Reported on 01/29/2018) 60 capsule 0   No facility-administered medications prior to visit.     PAST MEDICAL HISTORY: Past Medical History:  Diagnosis Date  . Diabetes mellitus without complication (HCC)   . Hypertension   . Persistent atrial fibrillation (HCC)    chads2vasc is 2.  on xarelto  . Stroke [facility](HCC)     PAST SURGICAL HISTORY: Past Surgical History:  Procedure Laterality Date  . CARDIOVERSION N/A 10/19/2015   Procedure: CARDIOVERSION;  Surgeon: Chilton Siiffany [place], MD;  Location[facility] END[facility];  Service: Cardiovascular;  Laterality: N/A;  . KNEE ARTHROSCOPY      FAMILY HISTORY: Family History  Problem Relation Age of Onset  . Heart disease Father   . Heart failure Father     SOCIAL HISTORY: Social History   Socioeconomic History  . Marital status: Married    Spouse name: Not on file  . Number of children: Not on file  . Years of education: Not on file  . Highest education level: Not on file  Social Needs  . Financial resource strain: Not on file  . Food insecurity - worry: Not on file  . Food insecurity - inability: Not on file  . Transportation needs - medical: Not on file  . Transportation needs - non-medical: Not on file  Occupational History  . Not on file  Tobacco Use  . Smoking status: Former Games developermoker  . Smokeless tobacco: Never Used  . Tobacco comment: Quit 1997  Substance and Sexual Activity  . Alcohol use: Yes    Alcohol/week: 1.8 oz     Types: 1 Glasses of wine, 1 Cans of beer, 1 Shots of liquor per week    Comment: daily,limited socially  . Drug use: No  . Sexual activity: Not on file  Other Topics Concern  . Not on file  Social History Narrative  . Not on file     PHYSICAL EXAM  Vitals:   01/29/18 1114  BP: (!) 148/85  Pulse: 69  Weight: 281 lb (127.5 kg)   Body mass index is 38.11 kg/m.  Generalized: Well developed, obese male in no acute distress  Head: normocephalic and atraumatic,. Oropharynx benign  Neck: Supple, no carotid bruits  Cardiac: Regular rate rhythm, no murmur  Musculoskeletal: No deformity   Neurological examination   Mentation: Alert oriented to time, place, history taking. Attention span and concentration appropriate. Recent and remote memory intact.  Follows all commands speech and language fluent.   Cranial nerve II-XII: Fundoscopic exam not  done.Pupils were equal round reactive to light extraocular movements were full, visual field were full on confrontational test. Facial sensation and strength were normal. hearing was intact to finger rubbing bilaterally. Uvula tongue midline. head turning and shoulder shrug were normal and symmetric.Tongue protrusion into cheek strength was normal. Motor: normal bulk and tone, full strength in the BUE, BLE, fine finger movements normal, no pronator drift. No focal weakness Sensory: normal and symmetric to light touch, in the upper and lower extremities Coordination: finger-nose-finger, heel-to-shin bilaterally, no dysmetria, no tremor Reflexes: 1+ upper lower and symmetric plantar responses were flexor bilaterally. Gait and Station: Rising up from seated position without assistance, normal stance,  moderate stride, good arm swing, smooth turning, able to perform tiptoe, and heel walking without difficulty. Tandem gait is steady  DIAGNOSTIC DATA (LABS, IMAGING, TESTING) - I reviewed patient records, labs, notes, testing and imaging myself where  available.  Lab Results  Component Value Date   WBC 9.4 05/03/2017   HGB 15.3 05/03/2017   HCT 45.0 05/03/2017   MCV 89.7 05/03/2017   PLT 234 05/03/2017      Component Value Date/Time   NA 137 05/03/2017 1936   K 4.1 05/03/2017 1936   CL 101 05/03/2017 1936   CO2 25 05/03/2017 1925   GLUCOSE 140 (H) 05/03/2017 1936   BUN 14 05/03/2017 1936   CREATININE 0.80 05/03/2017 1936   CALCIUM 8.9 05/03/2017 1925   PROT 6.7 05/03/2017 1925   ALBUMIN 3.6 05/03/2017 1925   AST 27 05/03/2017 1925   ALT 30 05/03/2017 1925   ALKPHOS 55 05/03/2017 1925   BILITOT 0.5 05/03/2017 1925   GFRNONAA >60 05/03/2017 1925   GFRAA >60 05/03/2017 1925   Lab Results  Component Value Date   CHOL 148 05/04/2017   HDL 44 05/04/2017   LDLCALC 79 05/04/2017   TRIG 126 05/04/2017   CHOLHDL 3.4 05/04/2017   Lab Results  Component Value Date   HGBA1C 7.2 (H) 05/04/2017    ASSESSMENT AND PLAN 40 year Caucasian male with transient expressive aphasia due to left hemispheric TIA 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 Pearlean Brownie who is out of the office today . This note is sent to the work in doctor.       PLAN:Stressed the importance of management of risk factors to prevent further stroke/TIA Continue Xarelto for secondary stroke prevention and atrial fibrillation Maintain strict control of hypertension with blood pressure goal below 130/90, today's reading 148/85 continue antihypertensive medications Control of diabetes with hemoglobin A1c below 6.5 followed by primary care continue diabetic medications Cholesterol with LDL cholesterol less than 70, followed by primary care,   Exercise by walking, recommend 30 minutes a day  eat healthy diet with whole grains,  fresh fruits and vegetables Continue follow-up with primary care and  cardiology Discharge from stroke clinic I spent 25 minutes in total face to face time with the patient more than 50% of which was spent counseling and coordination of care, reviewing test results reviewing medications and discussing and reviewing the diagnosis of stroke and management of risk factors.  Patient was also given written information which was reviewed with him and he will have for later reference. Nilda Riggs, [facility], [facility], APRN  [facility] [address] [place], [place] [address] [phone]

## 2018-01-29 ENCOUNTER — Encounter: Payer: Self-pay | Admitting: Nurse Practitioner

## 2018-01-29 ENCOUNTER — Ambulatory Visit (INDEPENDENT_AMBULATORY_CARE_PROVIDER_SITE_OTHER): Payer: BLUE CROSS/BLUE SHIELD | Admitting: Nurse Practitioner

## 2018-01-29 VITALS — BP 148/85 | HR 69 | Wt 281.0 lb

## 2018-01-29 DIAGNOSIS — I10 Essential (primary) hypertension: Secondary | ICD-10-CM | POA: Diagnosis not present

## 2018-01-29 DIAGNOSIS — Z8673 Personal history of transient ischemic attack (TIA), and cerebral infarction without residual deficits: Secondary | ICD-10-CM | POA: Insufficient documentation

## 2018-01-29 DIAGNOSIS — I480 Paroxysmal atrial fibrillation: Secondary | ICD-10-CM

## 2018-01-29 DIAGNOSIS — E118 Type 2 diabetes mellitus with unspecified complications: Secondary | ICD-10-CM

## 2018-01-29 NOTE — Progress Notes (Signed)
I agree with the assessment and plan as directed by NP .The patient is known to the stroke service.  Shell Yandow, MD  

## 2018-01-29 NOTE — Patient Instructions (Addendum)
Stressed the importance of management of risk factors to prevent further stroke Continue Xarelto for secondary stroke prevention and atrial fibrillation Maintain strict control of hypertension with blood pressure goal below 130/90, today's reading 148/85 continue antihypertensive medications Control of diabetes with hemoglobin A1c below 6.5 followed by primary care continue diabetic medications Cholesterol with LDL cholesterol less than 70, followed by primary care,   Exercise by walking, recommend 30 minutes a day  eat healthy diet with whole grains,  fresh fruits and vegetables Continue follow-up with primary care and cardiology Discharge from stroke clinic  Stroke Prevention Some medical conditions and behaviors are associated with a higher chance of having a stroke. You can help prevent a stroke by making nutrition, lifestyle, and other changes, including managing any medical conditions you may have. What nutrition changes can be made?  Eat healthy foods. You can do this by: ? Choosing foods high in fiber, such as fresh fruits and vegetables and whole grains. ? Eating at least 5 or more servings of fruits and vegetables a day. Try to fill half of your plate at each meal with fruits and vegetables. ? Choosing lean protein foods, such as lean cuts of meat, poultry without skin, fish, tofu, beans, and nuts. ? Eating low-fat dairy products. ? Avoiding foods that are high in salt (sodium). This can help lower blood pressure. ? Avoiding foods that have saturated fat, trans fat, and cholesterol. This can help prevent high cholesterol. ? Avoiding processed and premade foods.  Follow your health care provider's specific guidelines for losing weight, controlling high blood pressure (hypertension), lowering high cholesterol, and managing diabetes. These may include: ? Reducing your daily calorie intake. ? Limiting your daily sodium intake to 1,500 milligrams (mg). ? Using only healthy fats for  cooking, such as olive oil, canola oil, or sunflower oil. ? Counting your daily carbohydrate intake. What lifestyle changes can be made?  Maintain a healthy weight. Talk to your health care provider about your ideal weight.  Get at least 30 minutes of moderate physical activity at least 5 days a week. Moderate activity includes brisk walking, biking, and swimming.  Do not use any products that contain nicotine or tobacco, such as cigarettes and e-cigarettes. If you need help quitting, ask your health care provider. It may also be helpful to avoid exposure to secondhand smoke.  Limit alcohol intake to no more than 1 drink a day for nonpregnant women and 2 drinks a day for men. One drink equals 12 oz of beer, 5 oz of wine, or 1 oz of hard liquor.  Stop any illegal drug use.  Avoid taking birth control pills. Talk to your health care provider about the risks of taking birth control pills if: ? You are over 6 years old. ? You smoke. ? You get migraines. ? You have ever had a blood clot. What other changes can be made?  Manage your cholesterol levels. ? Eating a healthy diet is important for preventing high cholesterol. If cholesterol cannot be managed through diet alone, you may also need to take medicines. ? Take any prescribed medicines to control your cholesterol as told by your health care provider.  Manage your diabetes. ? Eating a healthy diet and exercising regularly are important parts of managing your blood sugar. If your blood sugar cannot be managed through diet and exercise, you may need to take medicines. ? Take any prescribed medicines to control your diabetes as told by your health care provider.  Control your hypertension. ?  To reduce your risk of stroke, try to keep your blood pressure below 130/80. ? Eating a healthy diet and exercising regularly are an important part of controlling your blood pressure. If your blood pressure cannot be managed through diet and exercise,  you may need to take medicines. ? Take any prescribed medicines to control hypertension as told by your health care provider. ? Ask your health care provider if you should monitor your blood pressure at home. ? Have your blood pressure checked every year, even if your blood pressure is normal. Blood pressure increases with age and some medical conditions.  Get evaluated for sleep disorders (sleep apnea). Talk to your health care provider about getting a sleep evaluation if you snore a lot or have excessive sleepiness.  Take over-the-counter and prescription medicines only as told by your health care provider. Aspirin or blood thinners (antiplatelets or anticoagulants) may be recommended to reduce your risk of forming blood clots that can lead to stroke.  Make sure that any other medical conditions you have, such as atrial fibrillation or atherosclerosis, are managed. What are the warning signs of a stroke? The warning signs of a stroke can be easily remembered as BEFAST.  B is for balance. Signs include: ? Dizziness. ? Loss of balance or coordination. ? Sudden trouble walking.  E is for eyes. Signs include: ? A sudden change in vision. ? Trouble seeing.  F is for face. Signs include: ? Sudden weakness or numbness of the face. ? The face or eyelid drooping to one side.  A is for arms. Signs include: ? Sudden weakness or numbness of the arm, usually on one side of the body.  S is for speech. Signs include: ? Trouble speaking (aphasia). ? Trouble understanding.  T is for time. ? These symptoms may represent a serious problem that is an emergency. Do not wait to see if the symptoms will go away. Get medical help right away. Call your local emergency services (911 in the [place] Do not drive yourself to the hospital.  Other signs of stroke may include: ? A sudden, severe headache with no known cause. ? Nausea or vomiting. ? Seizure.  Where to find more information: For more  information, visit:  American Stroke Association: www.strokeassociation.org  National Stroke Association: www.stroke.org  Summary  You can prevent a stroke by eating healthy, exercising, not smoking, limiting alcohol intake, and managing any medical conditions you may have.  Do not use any products that contain nicotine or tobacco, such as cigarettes and e-cigarettes. If you need help quitting, ask your health care provider. It may also be helpful to avoid exposure to secondhand smoke.  Remember BEFAST for warning signs of stroke. Get help right away if you or a loved one has any of these signs. This information is not intended to replace advice given to you by your health care provider. Make sure you discuss any questions you have with your health care provider. Document Released: 01/16/2005 Document Revised: 01/14/2017 Document Reviewed: 01/14/2017 Elsevier Interactive Patient Education  [employer]2018 [employer]

## 2018-07-12 NOTE — Progress Notes (Signed)
Cardiology Office Note:    Date:  07/15/2018   ID:  Gerald Baker, DOB [birth year], MRN [id number]  PCP:  Kirby Funk, MD  Cardiologist:  No primary care provider on file.   Referring MD: Kirby Funk, MD   Chief Complaint  Patient presents with  . Atrial Fibrillation    History of Present Illness:    Gerald Baker is a 67 y.o. male with a hx of paroxysmal atrial fibrillation, hypertension, chronic anticoagulation therapy, obesity, and suspected obstructive sleep apnea.Recent neurological event on anticoagulation therapy with Xarelto.  He has episodes of atrial fibrillation that occur up to 3-6 times per year.  He knows atrial fibrillation is present because it usually is there when he awakens in the morning, he has palpitations, and extreme fatigue.  This is been the complaint with prior episodes.  When present he will take a 300 mg dose of flecainide and within 2 hours he is improved.  He otherwise is doing well.  He denies chest pain.  No syncope.  He has sleep apnea but has refused therapy.  No bleeding complications on Xarelto.   Past Medical History:  Diagnosis Date  . Diabetes mellitus without complication (HCC)   . Hypertension   . Persistent atrial fibrillation (HCC)    chads2vasc is 2.  on xarelto  . Stroke [facility])     Past Surgical History:  Procedure Laterality Date  . CARDIOVERSION N/A 10/19/2015   Procedure: CARDIOVERSION;  Surgeon: Chilton Si, MD;  Location: [facility] ENDOSCOPY;  Service: Cardiovascular;  Laterality: N/A;  . KNEE ARTHROSCOPY      Current Medications: Current Meds  Medication Sig  . flecainide (TAMBOCOR) 100 MG tablet TAKE 3 TABLETS (300MG )  BY MOUTH ONCE, MAY REPEAT IN 36 HOURS FOR AFIB  . irbesartan (AVAPRO) 150 MG tablet Take 150 mg by mouth daily.  . metFORMIN (GLUCOPHAGE-XR) 500 MG 24 hr tablet Take 1,000 mg by mouth 2 (two) times daily.  . metoprolol (LOPRESSOR) 100 MG tablet Take 1 tablet (100 mg total) by mouth 2 (two) times  daily.  [name] 20 MG TABS tablet Take 20 mg by mouth daily.      Allergies:   Altace [ramipril]; Cozaar [losartan potassium]; Penicillins; Sulfa antibiotics; and Sulfur   Social History   Socioeconomic History  . Marital status: Married    Spouse name: Not on file  . Number of children: Not on file  . Years of education: Not on file  . Highest education level: Not on file  Occupational History  . Not on file  Social Needs  . Financial resource strain: Not on file  . Food insecurity:    Worry: Not on file    Inability: Not on file  . Transportation needs:    Medical: Not on file    Non-medical: Not on file  Tobacco Use  . Smoking status: Former Games developer  . Smokeless tobacco: Never Used  . Tobacco comment: Quit 1997  Substance and Sexual Activity  . Alcohol use: Yes    Alcohol/week: 1.8 oz    Types: 1 Glasses of wine, 1 Cans of beer, 1 Shots of liquor per week    Comment: daily,limited socially  . Drug use: No  . Sexual activity: Not on file  Lifestyle  . Physical activity:    Days per week: Not on file    Minutes per session: Not on file  . Stress: Not on file  Relationships  . Social connections:    Talks on  phone: Not on file    Gets together: Not on file    Attends religious service: Not on file    Active member of club or organization: Not on file    Attends meetings of clubs or organizations: Not on file    Relationship status: Not on file  Other Topics Concern  . Not on file  Social History Narrative  . Not on file     Family History: The patient's family history includes Heart disease in his father; Heart failure in his father.  ROS:   Please see the history of present illness.    No major complaints.  No significant bruising on anticoagulation.  All other systems reviewed and are negative.  EKGs/Labs/Other Studies Reviewed:    The following studies were reviewed today: No new data  EKG:  EKG is  ordered today.  The ekg ordered today  demonstrates sinus bradycardia at 59 bpm another tracing demonstrated 61 bpm.  Early QRS transition.  Normal appearance.  Recent Labs: No results found for requested labs within last 8760 hours.  Recent Lipid Panel    Component Value Date/Time   CHOL 148 05/04/2017 0528   TRIG 126 05/04/2017 0528   HDL 44 05/04/2017 0528   CHOLHDL 3.4 05/04/2017 0528   VLDL 25 05/04/2017 0528   LDLCALC 79 05/04/2017 0528    Physical Exam:    VS:  BP (!) 146/92   Pulse 61   Ht 6' (1.829 m)   Wt 276 lb 3.2 oz (125.3 kg)   BMI 37.46 kg/m     Wt Readings from Last 3 Encounters:  07/15/18 276 lb 3.2 oz (125.3 kg)  01/29/18 281 lb (127.5 kg)  07/22/17 282 lb 12.8 oz (128.3 kg)     GEN: Significant obesity.  Otherwise well nourished, well developed in no acute distress HEENT: Normal NECK: No JVD. LYMPHATICS: No lymphadenopathy CARDIAC: RRR, no murmur, no gallop, no edema. VASCULAR: Faint radial and posterior tibial pulses.  No bruits. RESPIRATORY:  Clear to auscultation without rales, wheezing or rhonchi  ABDOMEN: Soft, non-tender, non-distended, No pulsatile mass, MUSCULOSKELETAL: No deformity  SKIN: Warm and dry NEUROLOGIC:  Alert and oriented x 3 PSYCHIATRIC:  Normal affect   ASSESSMENT:    1. Paroxysmal atrial fibrillation (HCC)   2. Essential hypertension   3. History of TIA (transient ischemic attack)   4. Dyslipidemia   5. Chronic diastolic heart failure (HCC)   6. Chronic anticoagulation    PLAN:    In order of problems listed above:  1. Probable triggers include obstructive sleep apnea (not being treated), obesity, and intrinsic predisposition.  Current management is pill in a pocket with flecainide 300 mg dosing.  He has had 3 episodes over the last 6 months.  Quality of life is not suffering.  No changes are recommended. 2. Excellent blood pressure control.  Target 130/80 mmHg given diabetes and vascular disease. 3. Continue lifelong anticoagulation therapy 4. Target  less than 70 given diabetes history.  Most recent LDL was 87.  Consider a low intensity statin therapy. 5. No evidence of volume overload. 6. Continue chronic anticoagulation therapy.  Monitor for bleeding.   Medication Adjustments/Labs and Tests Ordered: Current medicines are reviewed at length with the patient today.  Concerns regarding medicines are outlined above.  Orders Placed This Encounter  Procedures  . EKG 12-Lead   No orders of the defined types were placed in this encounter.   Patient Instructions  Medication Instructions:  Your physician recommends  that you continue on your current medications as directed. Please refer to the Current Medication list given to you today. =  Labwork: None  Testing/Procedures: None  Follow-Up: Your physician wants you to follow-up in: 1 year with Dr. Katrinka Blazing.  You will receive a reminder letter in the mail two months in advance. If you don't receive a letter, please call our office to schedule the follow-up appointment.   Any Other Special Instructions Will Be Listed Below (If Applicable).     If you need a refill on your cardiac medications before your next appointment, please call your pharmacy.      Signed, Lesleigh Noe, MD  07/15/2018 11:35 AM    [facility]

## 2018-07-15 ENCOUNTER — Ambulatory Visit (INDEPENDENT_AMBULATORY_CARE_PROVIDER_SITE_OTHER): Payer: BLUE CROSS/BLUE SHIELD | Admitting: Interventional Cardiology

## 2018-07-15 ENCOUNTER — Encounter: Payer: Self-pay | Admitting: Interventional Cardiology

## 2018-07-15 VITALS — BP 146/92 | HR 61 | Ht 72.0 in | Wt 276.2 lb

## 2018-07-15 DIAGNOSIS — Z8673 Personal history of transient ischemic attack (TIA), and cerebral infarction without residual deficits: Secondary | ICD-10-CM

## 2018-07-15 DIAGNOSIS — I10 Essential (primary) hypertension: Secondary | ICD-10-CM

## 2018-07-15 DIAGNOSIS — E785 Hyperlipidemia, unspecified: Secondary | ICD-10-CM | POA: Diagnosis not present

## 2018-07-15 DIAGNOSIS — I480 Paroxysmal atrial fibrillation: Secondary | ICD-10-CM

## 2018-07-15 DIAGNOSIS — I5032 Chronic diastolic (congestive) heart failure: Secondary | ICD-10-CM

## 2018-07-15 DIAGNOSIS — Z7901 Long term (current) use of anticoagulants: Secondary | ICD-10-CM

## 2018-07-15 NOTE — Patient Instructions (Signed)

## 2018-09-18 DIAGNOSIS — Z23 Encounter for immunization: Secondary | ICD-10-CM | POA: Diagnosis not present

## 2018-10-12 DIAGNOSIS — I10 Essential (primary) hypertension: Secondary | ICD-10-CM | POA: Diagnosis not present

## 2018-10-12 DIAGNOSIS — E1169 Type 2 diabetes mellitus with other specified complication: Secondary | ICD-10-CM | POA: Diagnosis not present

## 2018-10-15 ENCOUNTER — Other Ambulatory Visit: Payer: Self-pay

## 2018-10-15 MED ORDER — FLECAINIDE ACETATE 100 MG PO TABS: ORAL_TABLET | ORAL | 3 refills | Status: DC

## 2018-12-25 DIAGNOSIS — L814 Other melanin hyperpigmentation: Secondary | ICD-10-CM | POA: Diagnosis not present

## 2018-12-25 DIAGNOSIS — L570 Actinic keratosis: Secondary | ICD-10-CM | POA: Diagnosis not present

## 2018-12-25 DIAGNOSIS — D225 Melanocytic nevi of trunk: Secondary | ICD-10-CM | POA: Diagnosis not present

## 2018-12-25 DIAGNOSIS — L821 Other seborrheic keratosis: Secondary | ICD-10-CM | POA: Diagnosis not present

## 2018-12-28 DIAGNOSIS — H5213 Myopia, bilateral: Secondary | ICD-10-CM | POA: Diagnosis not present

## 2019-02-01 ENCOUNTER — Other Ambulatory Visit: Payer: Self-pay | Admitting: Internal Medicine

## 2019-02-01 DIAGNOSIS — Z136 Encounter for screening for cardiovascular disorders: Secondary | ICD-10-CM

## 2019-02-01 DIAGNOSIS — I10 Essential (primary) hypertension: Secondary | ICD-10-CM | POA: Diagnosis not present

## 2019-02-01 DIAGNOSIS — I480 Paroxysmal atrial fibrillation: Secondary | ICD-10-CM | POA: Diagnosis not present

## 2019-02-01 DIAGNOSIS — E1169 Type 2 diabetes mellitus with other specified complication: Secondary | ICD-10-CM | POA: Diagnosis not present

## 2019-02-01 DIAGNOSIS — Z23 Encounter for immunization: Secondary | ICD-10-CM | POA: Diagnosis not present

## 2019-02-02 DIAGNOSIS — Z0120 Encounter for dental examination and cleaning without abnormal findings: Secondary | ICD-10-CM | POA: Diagnosis not present

## 2019-02-09 ENCOUNTER — Ambulatory Visit
Admission: RE | Admit: 2019-02-09 | Discharge: 2019-02-09 | Disposition: A | Discharge status: Home / Self Care | Payer: BLUE CROSS/BLUE SHIELD | Source: Ambulatory Visit | Attending: Internal Medicine | Admitting: Internal Medicine

## 2019-02-09 DIAGNOSIS — Z136 Encounter for screening for cardiovascular disorders: Secondary | ICD-10-CM

## 2019-02-09 DIAGNOSIS — F17210 Nicotine dependence, cigarettes, uncomplicated: Secondary | ICD-10-CM | POA: Diagnosis not present

## 2019-03-10 ENCOUNTER — Telehealth (HOSPITAL_COMMUNITY): Payer: Self-pay | Admitting: *Deleted

## 2019-03-10 MED ORDER — DILTIAZEM HCL 30 MG PO TABS
ORAL_TABLET | ORAL | 1 refills | Status: DC
Start: 2019-03-10 — End: 2021-08-17

## 2019-03-10 NOTE — Telephone Encounter (Signed)
Patient called in stating he went into AF yesterday took his normal dosing of flecainide PIP which did convert him but later that night he noticed his HR is running 130-140s. Bp 101/79.  Pt does not feel like he is in AF now. Discussed with Rudi Coco NP - will try cardizem 30mg  PRN for HR and see if this will convert him. He will call in tomorrow with update.

## 2019-03-11 NOTE — Telephone Encounter (Signed)
Pt took 2 doses of cardizem last night. States this morning his HR is in the 80s. Intermittent lightheadedness but BP is 120/70s. Discussed with Rudi Coco NP will continue current regimen with PRN cardizem as needed for elevated HRs. Will call on Monday with report of HRs.

## 2019-03-15 NOTE — Telephone Encounter (Signed)
Pt feels back to normal. HR and BP have normalized with no lightheadedness since we last talked. He will call if further issues.

## 2019-05-01 IMAGING — MR MR HEAD W/O CM
9 of 11 series · 29 of 48 positions shown · non-contrast
Comparison: 05/03/2017 CT and CTA of the head.

CLINICAL DATA: 65 y/o  M; expressive aphasia.

EXAM:
MRI HEAD WITHOUT CONTRAST
MRA HEAD WITHOUT CONTRAST
TECHNIQUE: Multiplanar, multiecho pulse sequences of the brain and surrounding
structures were obtained without intravenous contrast. Angiographic
images of the head were obtained using MRA technique without
contrast.

[Series 2: FLAIR · sagittal · 5.0mm · 0.47mm/px · 2 of 23 slices shown (1 of 2)]
[im 1/23]
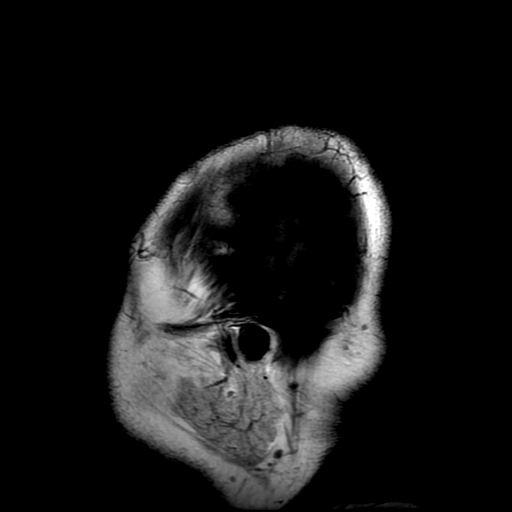
[im 23/23]
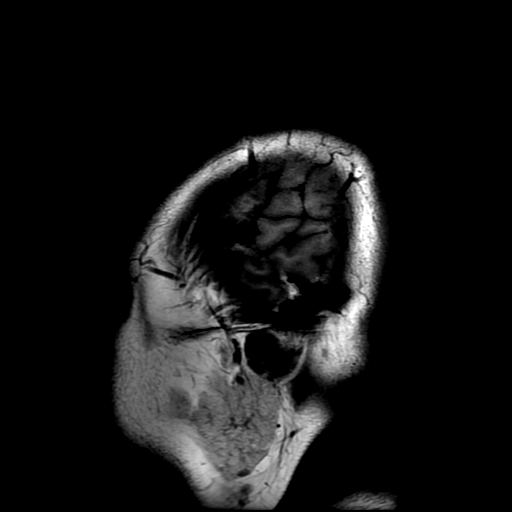

[Series 4: DWI · axial · 3.0mm · 0.94mm/px · z∈[-11,+147]mm · 7 of 108 slices shown (1 of 2)]
[im 1/108]
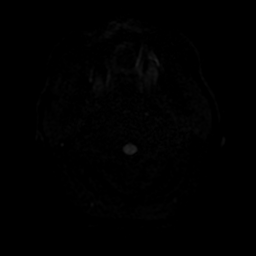
[im 18/108]
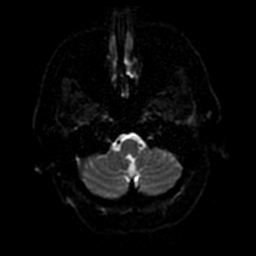
[im 36/108]
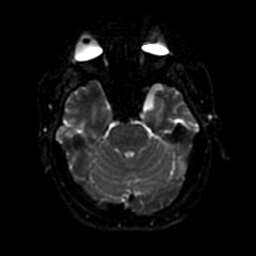
[im 54/108]
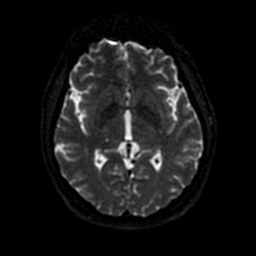
[im 72/108]
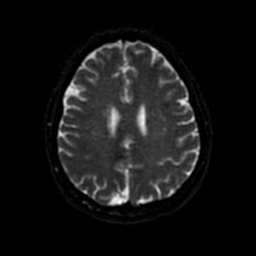
[im 90/108]
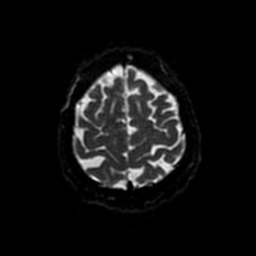
[im 108/108]
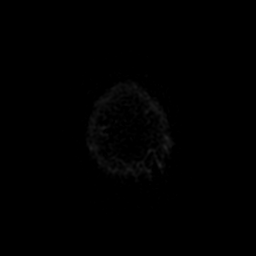

[Series 5: ax (id) 2 · axial · 1.0mm · 0.43mm/px · z∈[-21,+37]mm · 5 of 218 slices shown]
[im 1/218]
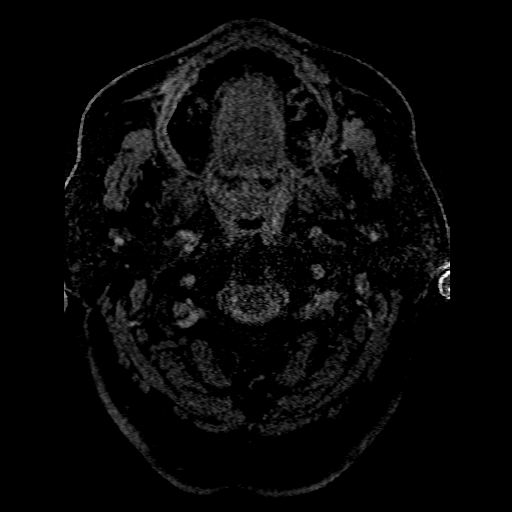
[im 34/218]
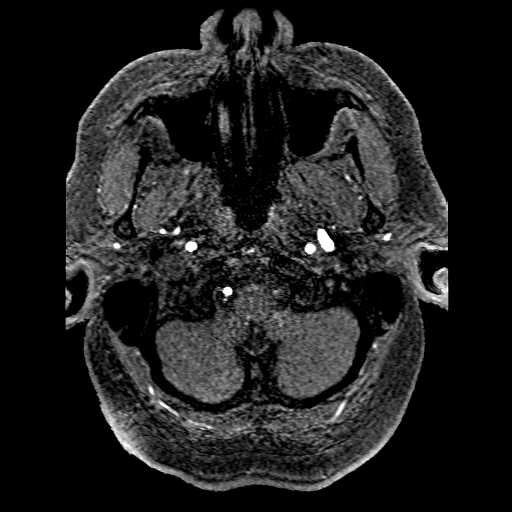
[im 67/218]
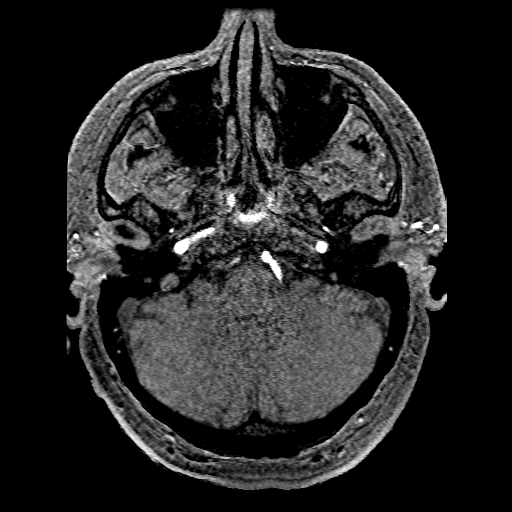
[im 101/218]
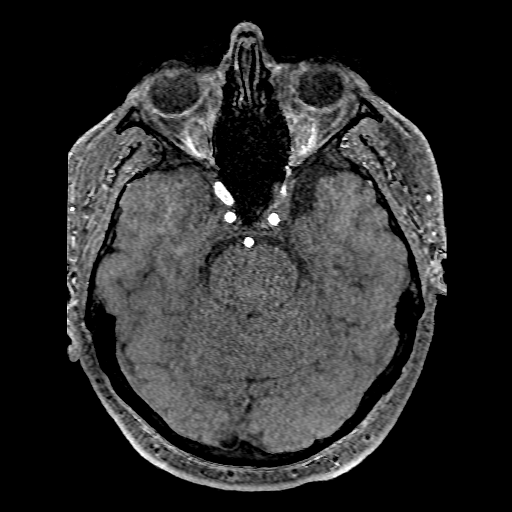
[im 117/218]
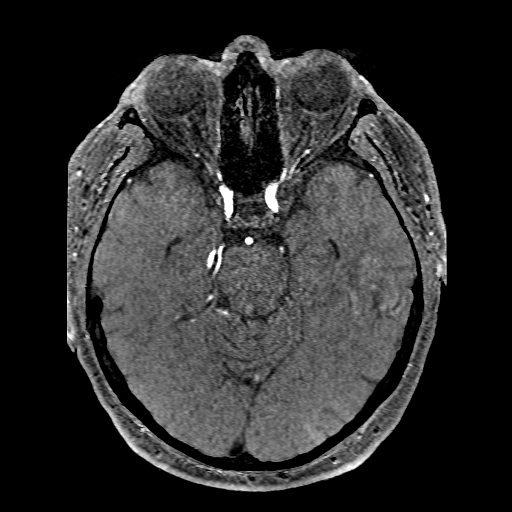

[Series 6: T2 · axial · 5.0mm · 0.47mm/px · z∈[-10,+146]mm · 2 of 27 slices shown (1 of 2)]
[im 1/27]
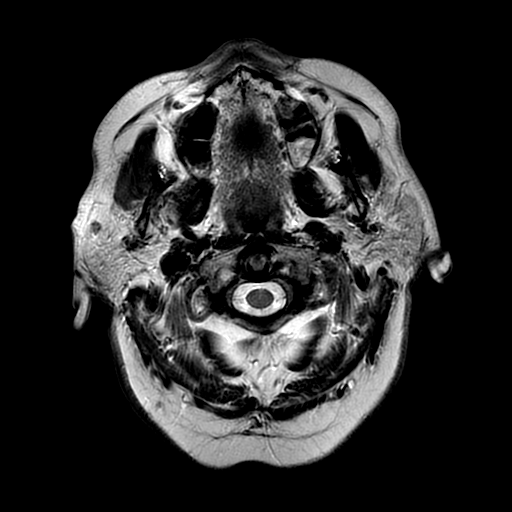
[im 27/27]
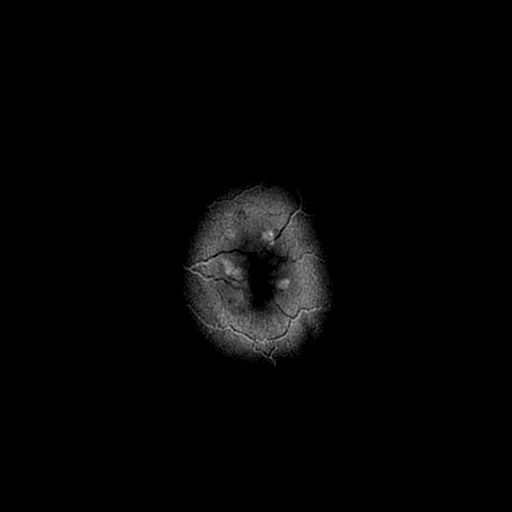

[Series 7: FLAIR · axial · 4.0mm · 0.41mm/px · z∈[-10,+144]mm · 2 of 29 slices shown (2 of 2)]
[im 1/29]
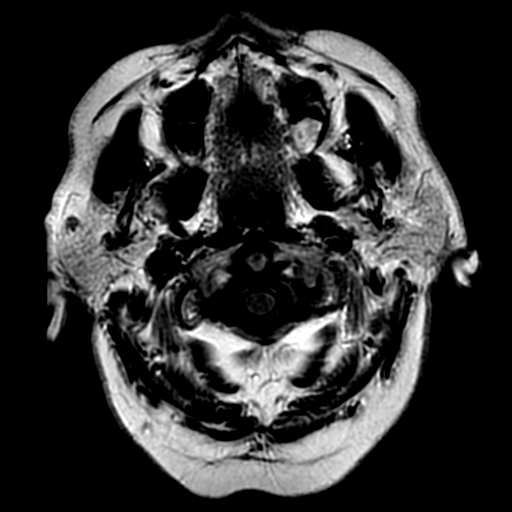
[im 29/29]
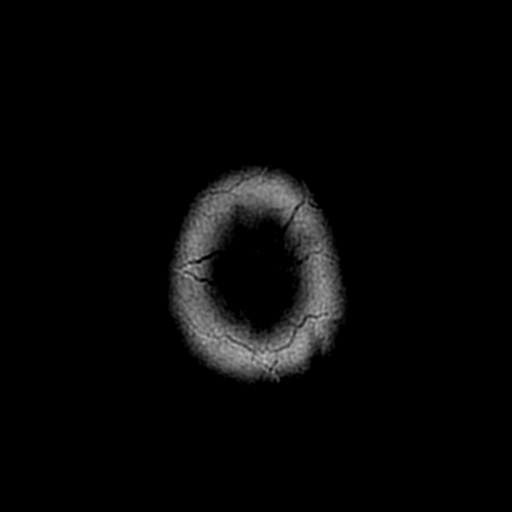

[Series 8: DWI · coronal · 4.0mm · 0.94mm/px · 4 of 68 slices shown (2 of 2)]
[im 1/68]
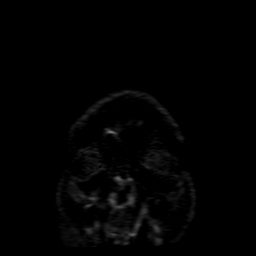
[im 23/68]
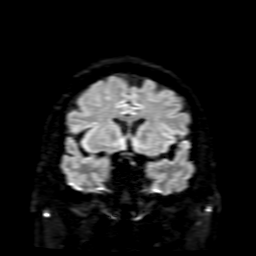
[im 45/68]
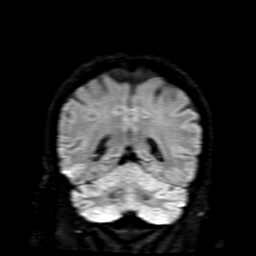
[im 68/68]
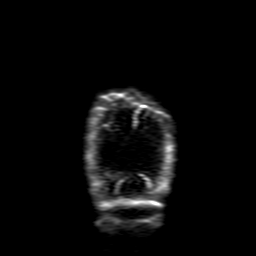

[Series 12: T2 · coronal · 5.0mm · 0.47mm/px · 2 of 28 slices shown (2 of 2)]
[im 1/28]
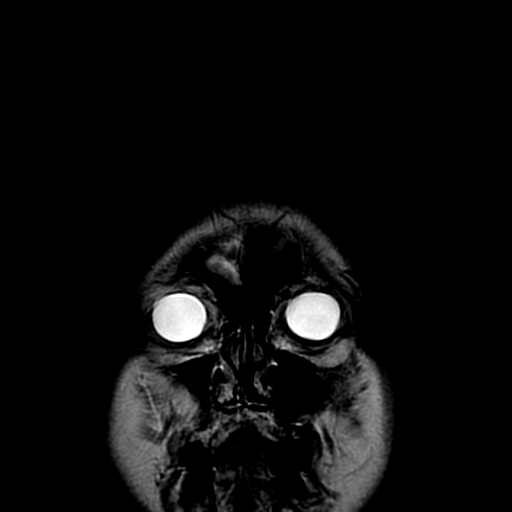
[im 28/28]
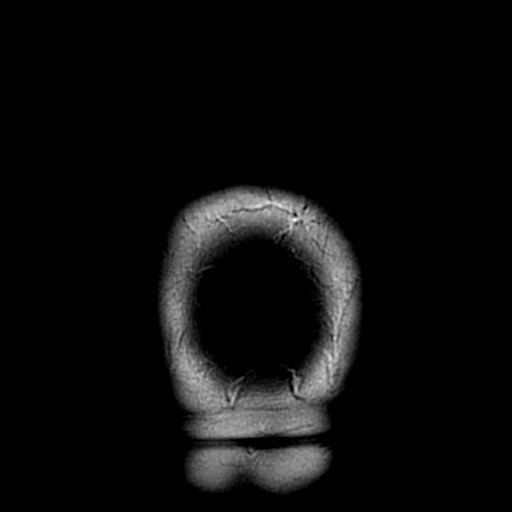

[Series 450: ADC · axial · 3.0mm · 0.94mm/px · z∈[-11,+147]mm · 3 of 54 slices shown (1 of 2)]
[im 1/54]
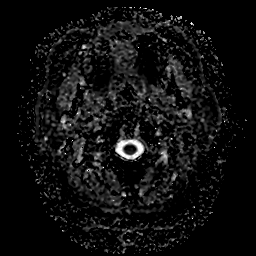
[im 27/54]
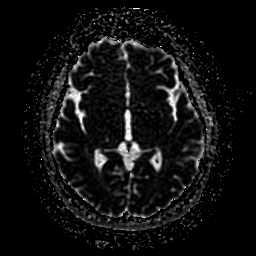
[im 54/54]
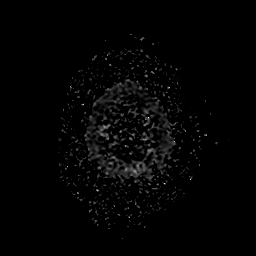

[Series 850: ADC · coronal · 4.0mm · 0.94mm/px · 2 of 34 slices shown (2 of 2)]
[im 1/34]
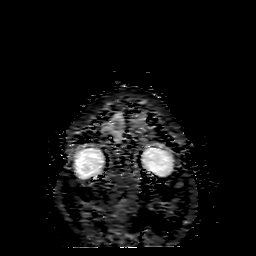
[im 34/34]
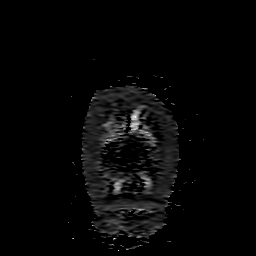

[29 of 48 positions shown; findings below may reference images not displayed]

FINDINGS: MRI HEAD FINDINGS

Brain: No acute infarction, hemorrhage, hydrocephalus, extra-axial
collection or mass lesion. Few nonspecific foci of T2 FLAIR
hyperintense signal abnormality in subcortical and periventricular
white matter are consistent with mild chronic microvascular ischemic
changes.

Vascular: Crescentic increased T2 signal is present within the left
V4 segment proximal to the left PICA origin (series 6, image 4).

Skull and upper cervical spine: Normal marrow signal.

Sinuses/Orbits: Negative.

Other: None.

MRA HEAD FINDINGS

Internal carotid arteries:  Patent.

Anterior cerebral arteries:  Patent.

Middle cerebral arteries: Patent.

Anterior communicating artery: Patent.

Posterior communicating arteries: Patent left and probable
diminutive right posterior communicating arteries.

Posterior cerebral arteries:  Patent.

Basilar artery:  Patent.

Vertebral arteries: Crescentic increased T2 signal within the wall
of the left V4 segment proximal to the left PICA origin is
compatible with dissection. Flow related signal within the left V4
segment between the left PICA origin and vertebrobasilar junction is
decreased in signal in comparison with the contralateral right
vertebral artery likely due to reversal of flow into the left
vertebral artery from the right vertebral artery and basilar artery.

No evidence of high-grade stenosis, large vessel occlusion, or
aneurysm unless noted above.
IMPRESSION: 1. Crescentic increased T2 signal is present within the left V4
segment proximal to left PICA origin compatible with dissection as
seen on CT angiogram.
2. Gradient diminished flow related signal within the left V4
segment from left vertebrobasilar junction to PICA origin indicating
reversal of flow.
3. Otherwise patent circle of Willis. No additional evidence for
large vessel occlusion, aneurysm, or significant stenosis is
identified.
4. No evidence of acute infarction, hemorrhage, or focal mass effect
of the brain.
5. Mild chronic microvascular ischemic changes of the brain.

By: Jadallah Jlayji M.D.

## 2019-06-08 DIAGNOSIS — I10 Essential (primary) hypertension: Secondary | ICD-10-CM | POA: Diagnosis not present

## 2019-06-08 DIAGNOSIS — Z7984 Long term (current) use of oral hypoglycemic drugs: Secondary | ICD-10-CM | POA: Diagnosis not present

## 2019-06-08 DIAGNOSIS — E1169 Type 2 diabetes mellitus with other specified complication: Secondary | ICD-10-CM | POA: Diagnosis not present

## 2019-08-03 DIAGNOSIS — Z0120 Encounter for dental examination and cleaning without abnormal findings: Secondary | ICD-10-CM | POA: Diagnosis not present

## 2019-10-03 NOTE — Progress Notes (Signed)
Cardiology Office Note:    Date:  10/05/2019   ID:  JOREL GRAVLIN, DOB 06/29/1951, MRN [id number]  PCP:  Lavone Orn, MD  Cardiologist:  Sinclair Grooms, MD   Referring MD: Lavone Orn, MD   Chief Complaint  Patient presents with  . Atrial Fibrillation    History of Present Illness:    GIANLUCCA SZYMBORSKI is a 68 y.o. male with a hx of paroxysmal atrial fibrillation, hypertension, chronic anticoagulation therapy, obesity, and suspected obstructive sleep apnea.Recent neurological event on anticoagulation therapy withXarelto.  He is doing well.  He is also involved with the atrial fibrillation clinic.  He uses flecainide pill in the pocket.  Over the past 12 months he has had 2 episodes of atrial fibrillation.  One episode required the addition of diltiazem 30 mg every 4 hours until his heart rate slows.  Each episode of atrial fibrillation of lasted less than 2 hours.  He knows when he is A. fib due to increasing heart rate and weakness that developed.  He has not had chest discomfort.  He denies orthopnea and PND.  No episodes of syncope have occurred.  No episodes of chest pain of occurred.  He is compliant with his medical regimen.  Past Medical History:  Diagnosis Date  . Diabetes mellitus without complication ([place])   . Hypertension   . Persistent atrial fibrillation ([place])    chads2vasc is 2.  on xarelto  . Stroke [facility])     Past Surgical History:  Procedure Laterality Date  . CARDIOVERSION N/A 10/19/2015   Procedure: CARDIOVERSION;  Surgeon: Skeet Latch, MD;  Location[facility] ENDOSCOPY;  Service: Cardiovascular;  Laterality: N/A;  . KNEE ARTHROSCOPY      Current Medications: Current Meds  Medication Sig  . diltiazem (CARDIZEM) 30 MG tablet Take 1 tablet every 4 hours AS NEEDED for AFIB heart rate >100 as long as top blood pressure >100.  . flecainide (TAMBOCOR) 100 MG tablet TAKE 3 TABLETS (300MG )  BY MOUTH ONCE, MAY REPEAT IN 36 HOURS FOR AFIB  . irbesartan  (AVAPRO) 150 MG tablet Take 150 mg by mouth daily.  . metFORMIN (GLUCOPHAGE-XR) 500 MG 24 hr tablet Take 1,000 mg by mouth 2 (two) times daily.  . metoprolol (LOPRESSOR) 100 MG tablet Take 1 tablet (100 mg total) by mouth 2 (two) times daily.  Alveda Reasons 20 MG TABS tablet Take 20 mg by mouth daily.      Allergies:   Altace [ramipril], Cozaar [losartan potassium], Penicillins, Sulfa antibiotics, and Sulfur   Social History   Socioeconomic History  . Marital status: Married    Spouse name: Not on file  . Number of children: Not on file  . Years of education: Not on file  . Highest education level: Not on file  Occupational History  . Not on file  Social Needs  . Financial resource strain: Not on file  . Food insecurity    Worry: Not on file    Inability: Not on file  . Transportation needs    Medical: Not on file    Non-medical: Not on file  Tobacco Use  . Smoking status: Former Research scientist (life sciences)  . Smokeless tobacco: Never Used  . Tobacco comment: Quit 1997  Substance and Sexual Activity  . Alcohol use: Yes    Alcohol/week: 3.0 standard drinks    Types: 1 Glasses of wine, 1 Cans of beer, 1 Shots of liquor per week    Comment: daily,limited socially  . Drug use:  No  . Sexual activity: Not on file  Lifestyle  . Physical activity    Days per week: Not on file    Minutes per session: Not on file  . Stress: Not on file  Relationships  . Social Musician on phone: Not on file    Gets together: Not on file    Attends religious service: Not on file    Active member of club or organization: Not on file    Attends meetings of clubs or organizations: Not on file    Relationship status: Not on file  Other Topics Concern  . Not on file  Social History Narrative  . Not on file     Family History: The patient's family history includes Heart disease in his father; Heart failure in his father.  ROS:   Please see the history of present illness.    Obese.  Physically and  active.  All other systems reviewed and are negative.  EKGs/Labs/Other Studies Reviewed:    The following studies were reviewed today: No recent imaging or functional data  EKG:  EKG demonstrates heart rate of 62, sinus rhythm, normal PR interval, normal overall appearance.  Recent Labs: No results found for requested labs within last 8760 hours.  Recent Lipid Panel    Component Value Date/Time   CHOL 148 05/04/2017 0528   TRIG 126 05/04/2017 0528   HDL 44 05/04/2017 0528   CHOLHDL 3.4 05/04/2017 0528   VLDL 25 05/04/2017 0528   LDLCALC 79 05/04/2017 0528    Physical Exam:    VS:  BP (!) 141/87   Pulse 62   Ht 6' (1.829 m)   Wt 266 lb 12.8 oz (121 kg)   SpO2 99%   BMI 36.18 kg/m     Wt Readings from Last 3 Encounters:  10/05/19 266 lb 12.8 oz (121 kg)  07/15/18 276 lb 3.2 oz (125.3 kg)  01/29/18 281 lb (127.5 kg)     GEN: Obese. No acute distress HEENT: Normal NECK: No JVD. LYMPHATICS: No lymphadenopathy CARDIAC:  RRR without murmur, gallop, or edema. VASCULAR:  Normal Pulses. No bruits. RESPIRATORY:  Clear to auscultation without rales, wheezing or rhonchi  ABDOMEN: Soft, non-tender, non-distended, No pulsatile mass, MUSCULOSKELETAL: No deformity  SKIN: Warm and dry NEUROLOGIC:  Alert and oriented x 3 PSYCHIATRIC:  Normal affect   ASSESSMENT:    1. Paroxysmal atrial fibrillation (HCC)   2. History of TIA (transient ischemic attack)   3. Essential hypertension   4. Dyslipidemia   5. Chronic diastolic heart failure (HCC)   6. Chronic anticoagulation   7. Educated about COVID-19 virus infection    PLAN:    In order of problems listed above:  1. Controlled currently with pill in the pocket.  Symptomatically atrial for ablation does occur.  2 episodes of atrial fibrillation over the last 12 months.  Flecainide pill in the pocket is working well. 2. No new or recurrent neurological events. 3. Blood pressure little high today at 140/86.  Target is 130/80.   He will monitor this at home.  Low-salt diet discussed. 4. Target LDL less than 70.. 5. No evidence of volume overload.  Only develops heart failure when in atrial fibrillation. 6. No bleeding on Xarelto.  Takes 20 mg/day. 7. The 3W's are discussed and endorses the patient is lifestyle changes.  With respect to secondary prevention, he is falling short of physical activity metric.  Blood pressure also needs to be monitored.  Overall education and awareness concerning primary/secondary risk prevention was discussed in detail: LDL less than 70, hemoglobin A1c less than 7, blood pressure target less than 130/80 mmHg, >150 minutes of moderate aerobic activity per week, avoidance of smoking, weight control (via diet and exercise), and continued surveillance/management of/for obstructive sleep apnea.    Medication Adjustments/Labs and Tests Ordered: Current medicines are reviewed at length with the patient today.  Concerns regarding medicines are outlined above.  Orders Placed This Encounter  Procedures  . EKG 12-Lead   No orders of the defined types were placed in this encounter.   Patient Instructions  Medication Instructions:  Your physician recommends that you continue on your current medications as directed. Please refer to the Current Medication list given to you today.  If you need a refill on your cardiac medications before your next appointment, please call your pharmacy.   Lab work: None If you have labs (blood work) drawn today and your tests are completely normal, you will receive your results only by: Marland Kitchen. MyChart Message (if you have MyChart) OR . A paper copy in the mail If you have any lab test that is abnormal or we need to change your treatment, we will call you to review the results.  Testing/Procedures: None  Follow-Up: At [facility], you and your health needs are our priority.  As part of our continuing mission to provide you with exceptional heart care, we have  created designated Provider Care Teams.  These Care Teams include your primary Cardiologist (physician) and Advanced Practice Providers (APPs -  Physician Assistants and Nurse Practitioners) who all work together to provide you with the care you need, when you need it. You will need a follow up appointment in 12 months.  Please call our office 2 months in advance to schedule this appointment.  You may see Lesleigh NoeHenry W Smith III, MD or one of the following Advanced Practice Providers on your designated Care Team:   Norma FredricksonLori Gerhardt, NP Nada BoozerLaura Ingold, NP . Georgie ChardJill McDaniel, NP  Any Other Special Instructions Will Be Listed Below (If Applicable).       Signed, Lesleigh NoeHenry W Smith III, MD  10/05/2019 8:26 AM    [facility]

## 2019-10-05 ENCOUNTER — Other Ambulatory Visit: Payer: Self-pay

## 2019-10-05 ENCOUNTER — Ambulatory Visit: Payer: Medicare Other | Admitting: Interventional Cardiology

## 2019-10-05 ENCOUNTER — Encounter: Payer: Self-pay | Admitting: Interventional Cardiology

## 2019-10-05 VITALS — BP 141/87 | HR 62 | Ht 72.0 in | Wt 266.8 lb

## 2019-10-05 DIAGNOSIS — I480 Paroxysmal atrial fibrillation: Secondary | ICD-10-CM

## 2019-10-05 DIAGNOSIS — Z8673 Personal history of transient ischemic attack (TIA), and cerebral infarction without residual deficits: Secondary | ICD-10-CM | POA: Diagnosis not present

## 2019-10-05 DIAGNOSIS — I5032 Chronic diastolic (congestive) heart failure: Secondary | ICD-10-CM

## 2019-10-05 DIAGNOSIS — I10 Essential (primary) hypertension: Secondary | ICD-10-CM

## 2019-10-05 DIAGNOSIS — E785 Hyperlipidemia, unspecified: Secondary | ICD-10-CM | POA: Diagnosis not present

## 2019-10-05 DIAGNOSIS — Z7189 Other specified counseling: Secondary | ICD-10-CM

## 2019-10-05 DIAGNOSIS — Z7901 Long term (current) use of anticoagulants: Secondary | ICD-10-CM

## 2019-10-05 NOTE — Patient Instructions (Signed)

## 2019-10-08 DIAGNOSIS — E1169 Type 2 diabetes mellitus with other specified complication: Secondary | ICD-10-CM | POA: Diagnosis not present

## 2019-10-08 DIAGNOSIS — I10 Essential (primary) hypertension: Secondary | ICD-10-CM | POA: Diagnosis not present

## 2019-10-08 DIAGNOSIS — Z1211 Encounter for screening for malignant neoplasm of colon: Secondary | ICD-10-CM | POA: Diagnosis not present

## 2019-11-15 DIAGNOSIS — Z1211 Encounter for screening for malignant neoplasm of colon: Secondary | ICD-10-CM | POA: Diagnosis not present

## 2019-11-15 DIAGNOSIS — Z8639 Personal history of other endocrine, nutritional and metabolic disease: Secondary | ICD-10-CM | POA: Diagnosis not present

## 2019-11-15 DIAGNOSIS — I480 Paroxysmal atrial fibrillation: Secondary | ICD-10-CM | POA: Diagnosis not present

## 2019-11-17 ENCOUNTER — Telehealth: Payer: Self-pay | Admitting: *Deleted

## 2019-11-17 NOTE — Telephone Encounter (Signed)
Pt takes Xarelto for afib with CHADS2VASc score of 6 (age, CHF, HTN, DM, TIA). Renal function is normal (SCr 0.79 on 02/01/19). Recommend only holding Xarelto for 1 day prior to procedure due to elevated cardiac risk.

## 2019-11-17 NOTE — Telephone Encounter (Signed)
   [facility] Pre-operative Risk Assessment    Request for surgical clearance:  1. What type of surgery is being performed? COLONOSCOPY   2. When is this surgery scheduled? 12/24/19   3. What type of clearance is required (medical clearance vs. Pharmacy clearance to hold med vs. Both)? BOTH  4. Are there any medications that need to be held prior to surgery and how long? XARELTO TO HOLD 1-2 DAYS PRIOR   5. Practice name and name of physician performing surgery? [facility]; DR. Alessandra Bevels  6. What is your office phone number [phone]    7.   What is your office fax number [phone]  8.   Anesthesia type (None, local, MAC, general) ?  PROPOFOL    Julaine Hua 11/17/2019, 3:45 PM  _________________________________________________________________   (provider comments below)

## 2019-11-22 NOTE — Telephone Encounter (Signed)
   Primary Cardiologist: Sinclair Grooms, MD  Chart reviewed as part of pre-operative protocol coverage. Patient has a history of atrial fibrillation on Xarelto, hypertension, obesity, suspected sleep apnea, and CVA. He was recently seen by Dr. Tamala Julian on 10/05/2019 at which time patient was doing well from a cardiac standpoint. Given past medical history and time since last visit, based on ACC/AHA guidelines, IAM LIPSON would be at acceptable risk for the planned procedure without further cardiovascular testing.   Per Pharmacy: "Recommend only holding Xarelto for 1 day prior to procedure due to elevated cardiac risk."  I will route this recommendation to the requesting party via Epic fax function and remove from pre-op pool.  Please call with questions.  Darreld Mclean, PA-C 11/22/2019, 11:16 AM

## 2019-12-30 DIAGNOSIS — Z1159 Encounter for screening for other viral diseases: Secondary | ICD-10-CM | POA: Diagnosis not present

## 2020-01-04 DIAGNOSIS — D123 Benign neoplasm of transverse colon: Secondary | ICD-10-CM | POA: Diagnosis not present

## 2020-01-04 DIAGNOSIS — K635 Polyp of colon: Secondary | ICD-10-CM | POA: Diagnosis not present

## 2020-01-04 DIAGNOSIS — K648 Other hemorrhoids: Secondary | ICD-10-CM | POA: Diagnosis not present

## 2020-01-04 DIAGNOSIS — Z1211 Encounter for screening for malignant neoplasm of colon: Secondary | ICD-10-CM | POA: Diagnosis not present

## 2020-01-04 DIAGNOSIS — K5730 Diverticulosis of large intestine without perforation or abscess without bleeding: Secondary | ICD-10-CM | POA: Diagnosis not present

## 2020-01-07 DIAGNOSIS — D123 Benign neoplasm of transverse colon: Secondary | ICD-10-CM | POA: Diagnosis not present

## 2020-01-07 DIAGNOSIS — K635 Polyp of colon: Secondary | ICD-10-CM | POA: Diagnosis not present

## 2020-01-21 ENCOUNTER — Ambulatory Visit: Payer: Medicare Other

## 2020-01-26 DIAGNOSIS — H5213 Myopia, bilateral: Secondary | ICD-10-CM | POA: Diagnosis not present

## 2020-02-01 ENCOUNTER — Ambulatory Visit: Payer: Medicare Other

## 2020-02-08 DIAGNOSIS — I480 Paroxysmal atrial fibrillation: Secondary | ICD-10-CM | POA: Diagnosis not present

## 2020-02-08 DIAGNOSIS — Z0000 Encounter for general adult medical examination without abnormal findings: Secondary | ICD-10-CM | POA: Diagnosis not present

## 2020-02-08 DIAGNOSIS — I10 Essential (primary) hypertension: Secondary | ICD-10-CM | POA: Diagnosis not present

## 2020-02-08 DIAGNOSIS — Z1389 Encounter for screening for other disorder: Secondary | ICD-10-CM | POA: Diagnosis not present

## 2020-02-08 DIAGNOSIS — E113293 Type 2 diabetes mellitus with mild nonproliferative diabetic retinopathy without macular edema, bilateral: Secondary | ICD-10-CM | POA: Diagnosis not present

## 2020-02-15 DIAGNOSIS — Z0120 Encounter for dental examination and cleaning without abnormal findings: Secondary | ICD-10-CM | POA: Diagnosis not present

## 2020-06-21 DIAGNOSIS — I480 Paroxysmal atrial fibrillation: Secondary | ICD-10-CM | POA: Diagnosis not present

## 2020-06-21 DIAGNOSIS — E1169 Type 2 diabetes mellitus with other specified complication: Secondary | ICD-10-CM | POA: Diagnosis not present

## 2020-06-21 DIAGNOSIS — Z9189 Other specified personal risk factors, not elsewhere classified: Secondary | ICD-10-CM | POA: Diagnosis not present

## 2020-06-21 DIAGNOSIS — I10 Essential (primary) hypertension: Secondary | ICD-10-CM | POA: Diagnosis not present

## 2020-08-06 DIAGNOSIS — Z20828 Contact with and (suspected) exposure to other viral communicable diseases: Secondary | ICD-10-CM | POA: Diagnosis not present

## 2020-08-09 DIAGNOSIS — U071 COVID-19: Secondary | ICD-10-CM | POA: Diagnosis not present

## 2020-08-31 DIAGNOSIS — Z0120 Encounter for dental examination and cleaning without abnormal findings: Secondary | ICD-10-CM | POA: Diagnosis not present

## 2020-10-01 NOTE — Progress Notes (Signed)
Cardiology Office Note:    Date:  10/06/2020   ID:  Gerald Baker, DOB 1951/09/25, MRN [id number]  PCP:  Kirby Funk, MD  Cardiologist:  Lesleigh Noe, MD   Referring MD: Kirby Funk, MD   Chief Complaint  Patient presents with  . Atrial Fibrillation  . Advice Only    Aortic atherosclerosis  . Hyperlipidemia    History of Present Illness:    Gerald Baker is a 69 y.o. male with a hx of paroxysmal atrial fibrillation, hypertension, chronic anticoagulation therapy, obesity, and suspected obstructive sleep apnea. H/O neurological event on anticoagulation therapy withXarelto.  He uses a pill in the pocket for atrial fibrillation control.  This year he has had 3 episodes.  Flecainide has quickly resolved each episode.  He has had no chest pain or symptoms to suggest angina.  He is physically active.  He mows his own grass.  When in atrial fibrillation he does not have angina.  Past Medical History:  Diagnosis Date  . Diabetes mellitus without complication (HCC)   . Hypertension   . Persistent atrial fibrillation (HCC)    chads2vasc is 2.  on xarelto  . Stroke [facility])     Past Surgical History:  Procedure Laterality Date  . CARDIOVERSION N/A 10/19/2015   Procedure: CARDIOVERSION;  Surgeon: Chilton Si, MD;  Location: [facility] ENDOSCOPY;  Service: Cardiovascular;  Laterality: N/A;  . KNEE ARTHROSCOPY      Current Medications: Current Meds  Medication Sig  . diltiazem (CARDIZEM) 30 MG tablet Take 1 tablet every 4 hours AS NEEDED for AFIB heart rate >100 as long as top blood pressure >100.  . flecainide (TAMBOCOR) 100 MG tablet TAKE 3 TABLETS (300MG )  BY MOUTH ONCE, MAY REPEAT IN 36 HOURS FOR AFIB  . irbesartan (AVAPRO) 150 MG tablet Take 150 mg by mouth daily.  . metFORMIN (GLUCOPHAGE-XR) 500 MG 24 hr tablet Take 1,000 mg by mouth 2 (two) times daily.  . metoprolol (LOPRESSOR) 100 MG tablet Take 1 tablet (100 mg total) by mouth 2 (two) times daily.  20 MG  TABS tablet Take 20 mg by mouth daily.      Allergies:   Altace [ramipril], Cozaar [losartan potassium], Penicillins, Sulfa antibiotics, and Sulfur   Social History   Socioeconomic History  . Marital status: Married    Spouse name: Not on file  . Number of children: Not on file  . Years of education: Not on file  . Highest education level: Not on file  Occupational History  . Not on file  Tobacco Use  . Smoking status: Former [name]  . Smokeless tobacco: Never Used  . Tobacco comment: Quit 1997  Vaping Use  . Vaping Use: Never used  Substance and Sexual Activity  . Alcohol use: Yes    Alcohol/week: 3.0 standard drinks    Types: 1 Glasses of wine, 1 Cans of beer, 1 Shots of liquor per week    Comment: daily,limited socially  . Drug use: No  . Sexual activity: Not on file  Other Topics Concern  . Not on file  Social History Narrative  . Not on file   Social Determinants of Health   Financial Resource Strain:   . Difficulty of Paying Living Expenses: Not on file  Food Insecurity:   . Worried About Games developer in the Last Year: Not on file  . Ran Out of Food in the Last Year: Not on file  Transportation Needs:   .  Lack of Transportation (Medical): Not on file  . Lack of Transportation (Non-Medical): Not on file  Physical Activity:   . Days of Exercise per Week: Not on file  . Minutes of Exercise per Session: Not on file  Stress:   . Feeling of Stress : Not on file  Social Connections:   . Frequency of Communication with Friends and Family: Not on file  . Frequency of Social Gatherings with Friends and Family: Not on file  . Attends Religious Services: Not on file  . Active Member of Clubs or Organizations: Not on file  . Attends Banker Meetings: Not on file  . Marital Status: Not on file     Family History: The patient's family history includes Heart disease in his father; Heart failure in his father.  ROS:   Please see the history of  present illness.    He had Covid in August.  He had gotten both doses of mRNA vaccine.  He did not have atrial fibrillation while ill.  Symptoms were mild.  All other systems reviewed and are negative.  EKGs/Labs/Other Studies Reviewed:    The following studies were reviewed today: No recent cardiac imaging  EKG:  EKG normal sinus rhythm with normal appearance.  PR interval 130 ms.  Recent Labs: No results found for requested labs within last 8760 hours.  Recent Lipid Panel    Component Value Date/Time   CHOL 148 05/04/2017 0528   TRIG 126 05/04/2017 0528   HDL 44 05/04/2017 0528   CHOLHDL 3.4 05/04/2017 0528   VLDL 25 05/04/2017 0528   LDLCALC 79 05/04/2017 0528    Physical Exam:    VS:  BP 138/86   Pulse 62   Ht 6' (1.829 m)   Wt 257 lb 12.8 oz (116.9 kg)   SpO2 95%   BMI 34.96 kg/m     Wt Readings from Last 3 Encounters:  10/06/20 257 lb 12.8 oz (116.9 kg)  10/05/19 266 lb 12.8 oz (121 kg)  07/15/18 276 lb 3.2 oz (125.3 kg)     GEN: Morbid obesity. No acute distress HEENT: Normal NECK: No JVD. LYMPHATICS: No lymphadenopathy CARDIAC:  RRR without murmur, gallop, or edema. VASCULAR:  Normal Pulses. No bruits. RESPIRATORY:  Clear to auscultation without rales, wheezing or rhonchi  ABDOMEN: Soft, non-tender, non-distended, No pulsatile mass, MUSCULOSKELETAL: No deformity  SKIN: Warm and dry NEUROLOGIC:  Alert and oriented x 3 PSYCHIATRIC:  Normal affect   ASSESSMENT:    1. Paroxysmal atrial fibrillation (HCC)   2. History of TIA (transient ischemic attack)   3. Essential hypertension   4. Dyslipidemia   5. Chronic diastolic heart failure (HCC)   6. Chronic anticoagulation   7. Educated about COVID-19 virus infection    PLAN:    In order of problems listed above:  1. Pill in pocket with flecainide, working quite well.  3 episodes of atrial fibrillation since last year.  He had about the same number of episodes a year before.  He has cut back on alcohol.   He is getting better sleep.  All in all he is happy with the control that he has. 2. No recurrent transient ischemic attacks. 3. Blood pressure control is adequate although low-salt diet, being mindful of nonsteroidal anti-inflammatory therapy were discussed.  Target is 130/80 or less.  He is reluctant to take on more medications.  Continue Avapro, and Lopressor 100 mg twice daily.  4. His CV risk score is greater than 20  percent over the next 10 years.  He should be on statin therapy with aggressive lowering since he has had a TIA, he is diabetic, and has hypertension.  Recommend rosuvastatin 5 mg/day.  He will discuss this with Dr. Valentina Lucks. 5. No evidence of volume overload.  Continue diltiazem and Avapro therapy as listed. 6. Continue Xarelto 20 mg/day.  Watch for bleeding. 7. Has received 2 mRNA: Vaccines but still have breakthrough infection in August.  He has recovered, only had mild cold symptoms, and is doing well.   Medication Adjustments/Labs and Tests Ordered: Current medicines are reviewed at length with the patient today.  Concerns regarding medicines are outlined above.  Orders Placed This Encounter  Procedures  . EKG 12-Lead   No orders of the defined types were placed in this encounter.   Patient Instructions  Medication Instructions:  Your physician recommends that you continue on your current medications as directed. Please refer to the Current Medication list given to you today.   *If you need a refill on your cardiac medications before your next appointment, please call your pharmacy*   Lab Work: None If you have labs (blood work) drawn today and your tests are completely normal, you will receive your results only by: Marland Kitchen MyChart Message (if you have MyChart) OR . A paper copy in the mail If you have any lab test that is abnormal or we need to change your treatment, we will call you to review the results.   Testing/Procedures: None   Follow-Up: At [facility], you and your health needs are our priority.  As part of our continuing mission to provide you with exceptional heart care, we have created designated Provider Care Teams.  These Care Teams include your primary Cardiologist (physician) and Advanced Practice Providers (APPs -  Physician Assistants and Nurse Practitioners) who all work together to provide you with the care you need, when you need it.  We recommend signing up for the patient portal called "MyChart".  Sign up information is provided on this After Visit Summary.  MyChart is used to connect with patients for Virtual Visits (Telemedicine).  Patients are able to view lab/test results, encounter notes, upcoming appointments, etc.  Non-urgent messages can be sent to your provider as well.   To learn more about what you can do with MyChart, go to [email or web address].    Your next appointment:   12 month(s)  The format for your next appointment:   In Person  Provider:   You may see Lesleigh Noe, MD or one of the following Advanced Practice Providers on your designated Care Team:    Norma Fredrickson, NP  Nada Boozer, NP  Georgie Chard, NP    Other Instructions      Signed, Lesleigh Noe, MD  10/06/2020 11:10 AM    [facility]

## 2020-10-06 ENCOUNTER — Encounter: Payer: Self-pay | Admitting: Interventional Cardiology

## 2020-10-06 ENCOUNTER — Ambulatory Visit: Payer: Medicare Other | Admitting: Interventional Cardiology

## 2020-10-06 ENCOUNTER — Other Ambulatory Visit: Payer: Self-pay

## 2020-10-06 VITALS — BP 138/86 | HR 62 | Ht 72.0 in | Wt 257.8 lb

## 2020-10-06 DIAGNOSIS — Z8673 Personal history of transient ischemic attack (TIA), and cerebral infarction without residual deficits: Secondary | ICD-10-CM | POA: Diagnosis not present

## 2020-10-06 DIAGNOSIS — Z7901 Long term (current) use of anticoagulants: Secondary | ICD-10-CM

## 2020-10-06 DIAGNOSIS — I480 Paroxysmal atrial fibrillation: Secondary | ICD-10-CM | POA: Diagnosis not present

## 2020-10-06 DIAGNOSIS — I5032 Chronic diastolic (congestive) heart failure: Secondary | ICD-10-CM

## 2020-10-06 DIAGNOSIS — I10 Essential (primary) hypertension: Secondary | ICD-10-CM

## 2020-10-06 DIAGNOSIS — Z7189 Other specified counseling: Secondary | ICD-10-CM

## 2020-10-06 DIAGNOSIS — E785 Hyperlipidemia, unspecified: Secondary | ICD-10-CM | POA: Diagnosis not present

## 2020-10-06 NOTE — Patient Instructions (Signed)

## 2020-10-10 DIAGNOSIS — G459 Transient cerebral ischemic attack, unspecified: Secondary | ICD-10-CM | POA: Diagnosis not present

## 2020-10-10 DIAGNOSIS — I480 Paroxysmal atrial fibrillation: Secondary | ICD-10-CM | POA: Diagnosis not present

## 2020-10-10 DIAGNOSIS — E1169 Type 2 diabetes mellitus with other specified complication: Secondary | ICD-10-CM | POA: Diagnosis not present

## 2020-10-10 DIAGNOSIS — I10 Essential (primary) hypertension: Secondary | ICD-10-CM | POA: Diagnosis not present

## 2020-11-06 ENCOUNTER — Telehealth: Payer: Self-pay | Admitting: *Deleted

## 2020-11-06 MED ORDER — FLECAINIDE ACETATE 100 MG PO TABS: ORAL_TABLET | ORAL | 3 refills | Status: DC

## 2020-11-06 NOTE — Telephone Encounter (Signed)
Patient called into Office to request refill on his flecainide 100  mg

## 2021-02-06 IMAGING — US US ABDOMINAL AORTA SCREENING AAA
1 series · 14 of 18 positions shown · non-contrast
Comparison: None.

CLINICAL DATA: Smoker, 67 years of age. First exam for aneurysm
screening

EXAM:
US ABDOMINAL AORTA MEDICARE SCREENING
TECHNIQUE: Ultrasound examination of the abdominal aorta was performed as a
screening evaluation for abdominal aortic aneurysm.

[Series 1: us abdominal aorta screening aaa · 0.36mm/px · 14 of 18 slices shown]
[im 1/18]
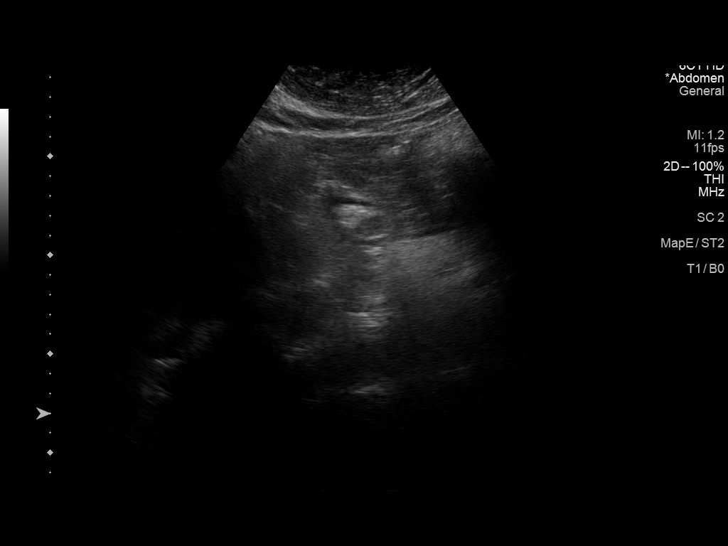
[im 2/18]
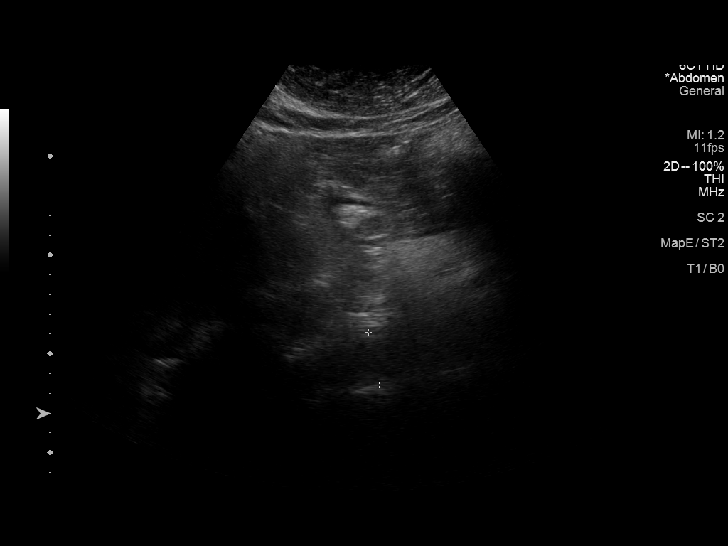
[im 4/18]
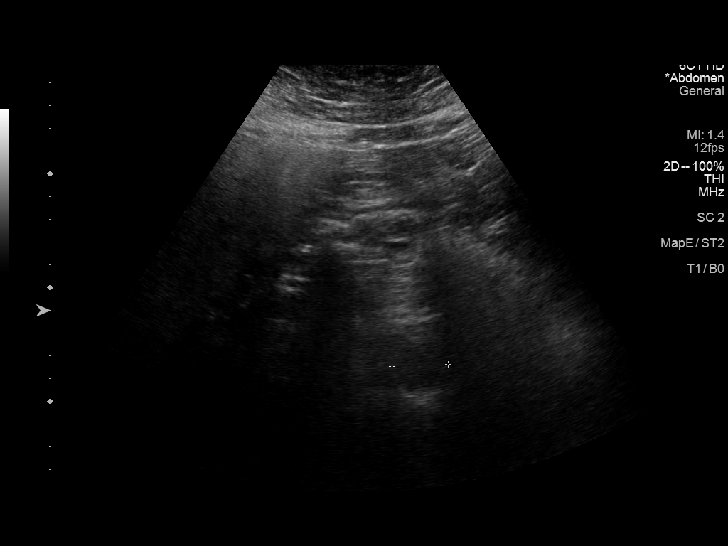
[im 5/18]
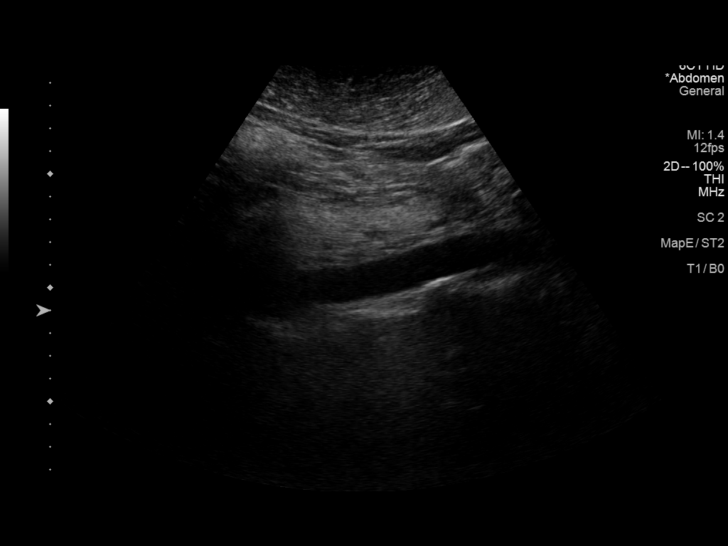
[im 6/18]
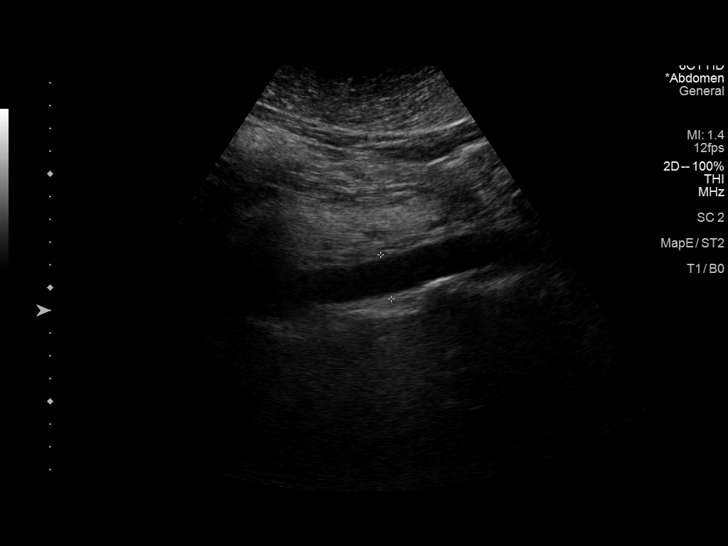
[im 8/18]
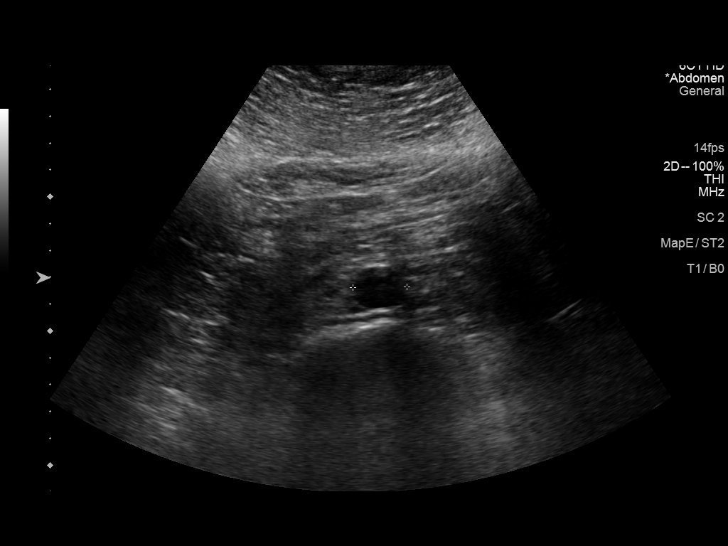
[im 9/18]
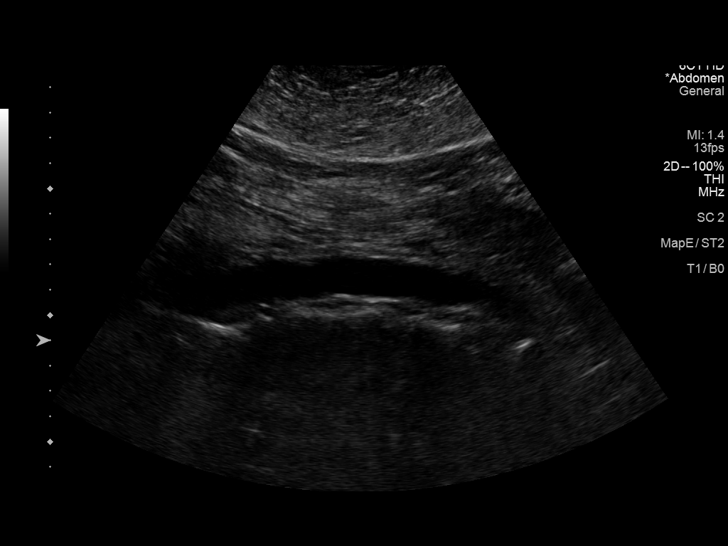
[im 10/18]
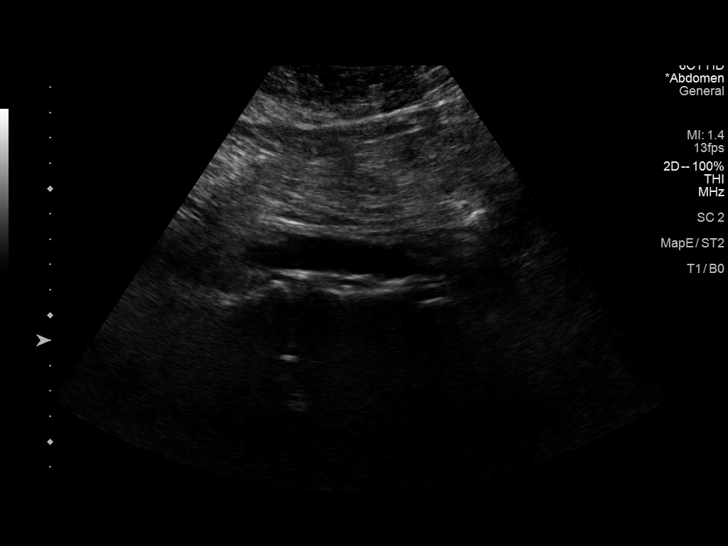
[im 11/18]
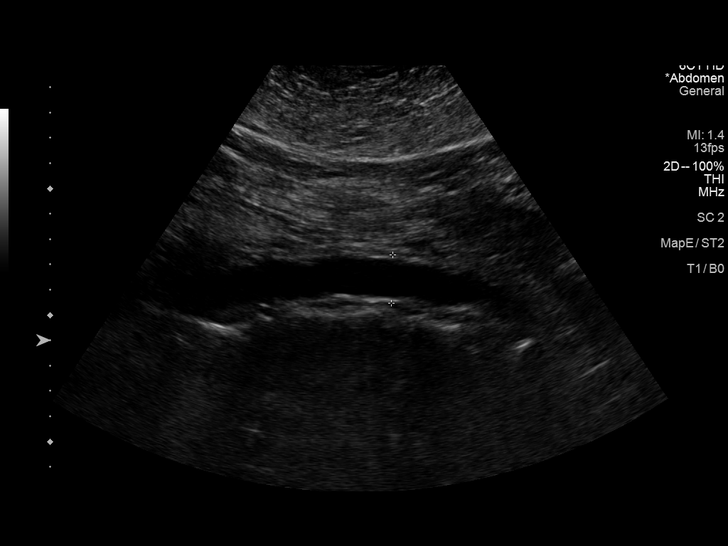
[im 13/18]
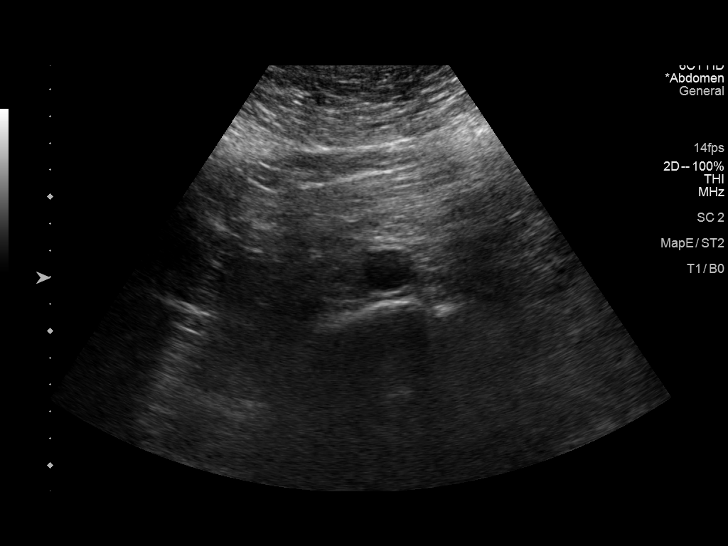
[im 14/18]
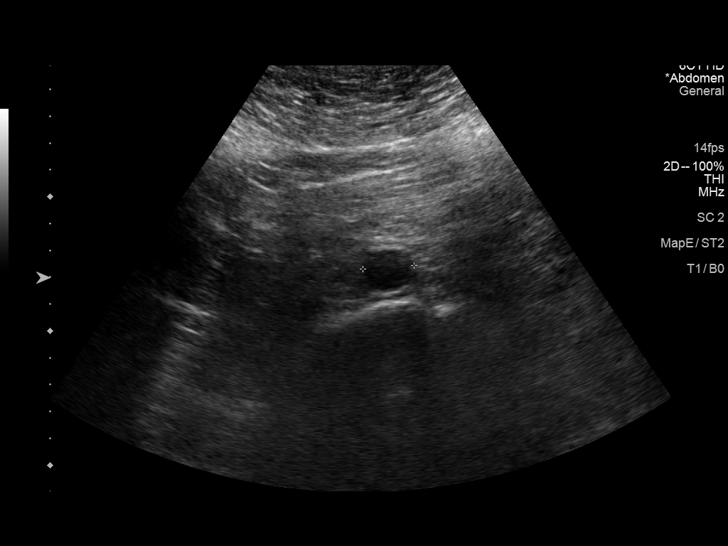
[im 15/18]
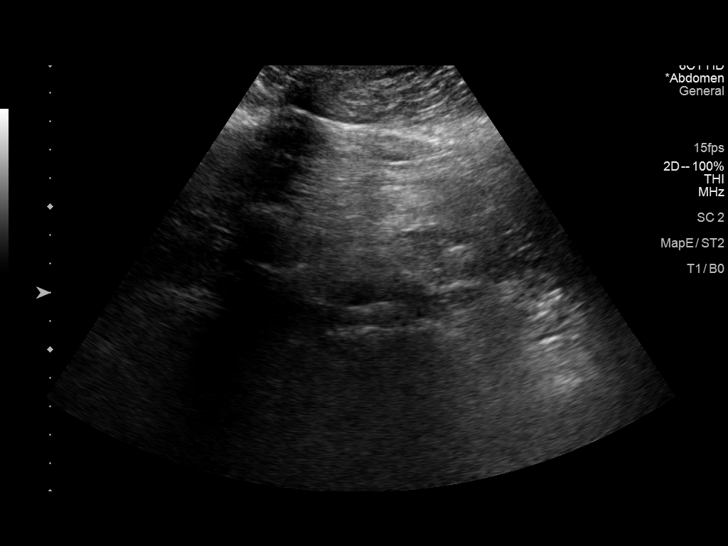
[im 17/18]
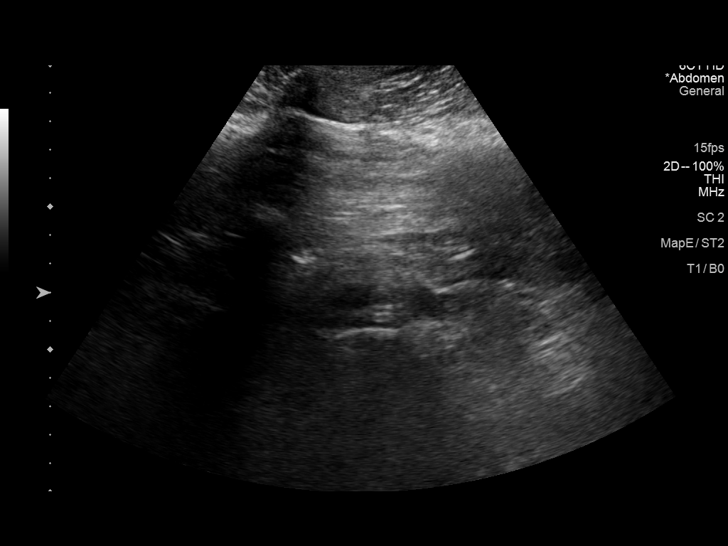
[im 18/18]
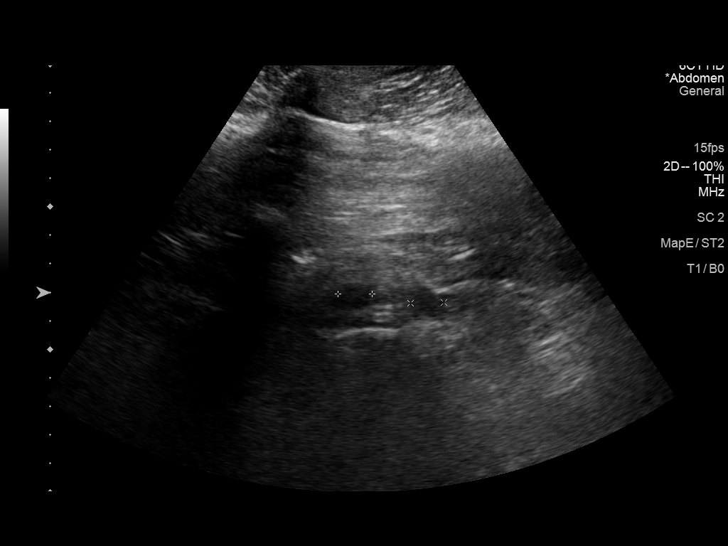

[14 of 18 positions shown; findings below may reference images not displayed]

FINDINGS: Abdominal aortic measurements as follows:

Proximal:  2.7 cm

Mid:  2.0 cm

Distal:  1.9 cm
IMPRESSION: Negative study.  No aortic aneurysm or ectasia.

## 2021-02-14 DIAGNOSIS — G4733 Obstructive sleep apnea (adult) (pediatric): Secondary | ICD-10-CM | POA: Diagnosis not present

## 2021-02-14 DIAGNOSIS — E1169 Type 2 diabetes mellitus with other specified complication: Secondary | ICD-10-CM | POA: Diagnosis not present

## 2021-02-14 DIAGNOSIS — I10 Essential (primary) hypertension: Secondary | ICD-10-CM | POA: Diagnosis not present

## 2021-02-14 DIAGNOSIS — Z0000 Encounter for general adult medical examination without abnormal findings: Secondary | ICD-10-CM | POA: Diagnosis not present

## 2021-02-14 DIAGNOSIS — Z1389 Encounter for screening for other disorder: Secondary | ICD-10-CM | POA: Diagnosis not present

## 2021-02-14 DIAGNOSIS — I480 Paroxysmal atrial fibrillation: Secondary | ICD-10-CM | POA: Diagnosis not present

## 2021-03-22 DIAGNOSIS — H5213 Myopia, bilateral: Secondary | ICD-10-CM | POA: Diagnosis not present

## 2021-06-14 DIAGNOSIS — E113293 Type 2 diabetes mellitus with mild nonproliferative diabetic retinopathy without macular edema, bilateral: Secondary | ICD-10-CM | POA: Diagnosis not present

## 2021-06-14 DIAGNOSIS — I480 Paroxysmal atrial fibrillation: Secondary | ICD-10-CM | POA: Diagnosis not present

## 2021-06-14 DIAGNOSIS — I10 Essential (primary) hypertension: Secondary | ICD-10-CM | POA: Diagnosis not present

## 2021-06-14 DIAGNOSIS — D6869 Other thrombophilia: Secondary | ICD-10-CM | POA: Diagnosis not present

## 2021-06-14 DIAGNOSIS — Z5181 Encounter for therapeutic drug level monitoring: Secondary | ICD-10-CM | POA: Diagnosis not present

## 2021-06-14 DIAGNOSIS — Z7984 Long term (current) use of oral hypoglycemic drugs: Secondary | ICD-10-CM | POA: Diagnosis not present

## 2021-08-16 ENCOUNTER — Ambulatory Visit (HOSPITAL_COMMUNITY)
Admission: RE | Admit: 2021-08-16 | Discharge: 2021-08-16 | Disposition: A | Discharge status: Home / Self Care | Payer: Medicare Other | Source: Ambulatory Visit | Attending: Nurse Practitioner | Admitting: Nurse Practitioner

## 2021-08-16 ENCOUNTER — Other Ambulatory Visit: Payer: Self-pay

## 2021-08-16 VITALS — BP 156/80 | HR 53 | Ht 72.0 in | Wt 248.2 lb

## 2021-08-16 DIAGNOSIS — I4819 Other persistent atrial fibrillation: Secondary | ICD-10-CM | POA: Insufficient documentation

## 2021-08-16 DIAGNOSIS — Z8249 Family history of ischemic heart disease and other diseases of the circulatory system: Secondary | ICD-10-CM | POA: Insufficient documentation

## 2021-08-16 DIAGNOSIS — Z87891 Personal history of nicotine dependence: Secondary | ICD-10-CM | POA: Insufficient documentation

## 2021-08-16 DIAGNOSIS — Z880 Allergy status to penicillin: Secondary | ICD-10-CM | POA: Insufficient documentation

## 2021-08-16 DIAGNOSIS — Z79899 Other long term (current) drug therapy: Secondary | ICD-10-CM | POA: Diagnosis not present

## 2021-08-16 DIAGNOSIS — D6869 Other thrombophilia: Secondary | ICD-10-CM

## 2021-08-16 DIAGNOSIS — Z7901 Long term (current) use of anticoagulants: Secondary | ICD-10-CM | POA: Insufficient documentation

## 2021-08-16 DIAGNOSIS — I10 Essential (primary) hypertension: Secondary | ICD-10-CM | POA: Insufficient documentation

## 2021-08-16 DIAGNOSIS — I480 Paroxysmal atrial fibrillation: Secondary | ICD-10-CM | POA: Diagnosis not present

## 2021-08-16 MED ORDER — FLECAINIDE ACETATE 50 MG PO TABS: 50.0000 mg | ORAL_TABLET | Freq: Two times a day (BID) | ORAL | 3 refills | Status: DC

## 2021-08-16 NOTE — Patient Instructions (Signed)
Start flecainide 50 mg 2x a day You can not longer take the big dose of flecainide as needed Will  see you  back 9/1 at 11:30

## 2021-08-16 NOTE — Progress Notes (Signed)
Primary Care Physician: Kirby Funk, MD Referring Physician: self referred Cardiologist: Dr. Ala Dach Gerald Baker is a 70 y.o. male with a h/o paroxysmal afib , HTN, mild LVH, that is in the afib clinic for increase of afib since the first of the year. I has continuously increased over the last several weeks and now is taking his pill in pocket 300 mg flecainide at lest once a week. It will quickly convert him to SR. I have not seen since 2017. Since then he has been following [employer] off and on and has lost weight. He is trying to be active and tries to walk daily. He is drinking alcohol but has cut back since I saw him last. He watches caffeine intake. He had a sleep study in the past but did not need CPAP. His Fitbit monitors his O2 during the night and he does not drop below 95%. He has not had a stress test but walks vigorously  and does not have any chest discomfort or untoward shortness of breath. CHA2DS2VASc  score of 5 and is on xarelto 20 mg daily.    Today, he denies symptoms of palpitations, chest pain, shortness of breath, orthopnea, PND, lower extremity edema, dizziness, presyncope, syncope, or neurologic sequela. The patient is tolerating medications without difficulties and is otherwise without complaint today.   Past Medical History:  Diagnosis Date   Diabetes mellitus without complication (HCC)    Hypertension    Persistent atrial fibrillation (HCC)    chads2vasc is 2.  on xarelto   Stroke [facility])    Past Surgical History:  Procedure Laterality Date   CARDIOVERSION N/A 10/19/2015   Procedure: CARDIOVERSION;  Surgeon: Chilton Si, MD;  Location: [facility] ENDOSCOPY;  Service: Cardiovascular;  Laterality: N/A;   KNEE ARTHROSCOPY      Current Outpatient Medications  Medication Sig Dispense Refill   diltiazem (CARDIZEM) 30 MG tablet Take 1 tablet every 4 hours AS NEEDED for AFIB heart rate >100 as long as top blood pressure >100. 45 tablet 1   irbesartan (AVAPRO)  150 MG tablet Take 150 mg by mouth daily.     metFORMIN (GLUCOPHAGE-XR) 500 MG 24 hr tablet Take 500 mg by mouth 2 (two) times daily.     metoprolol (LOPRESSOR) 100 MG tablet Take 1 tablet (100 mg total) by mouth 2 (two) times daily.     rosuvastatin (CRESTOR) 5 MG tablet Taking one tablet by mouth once weekly on Thursday     XARELTO 20 MG TABS tablet Take 20 mg by mouth daily.      flecainide (TAMBOCOR) 50 MG tablet Take 1 tablet (50 mg total) by mouth 2 (two) times daily. 60 tablet 3   No current facility-administered medications for this encounter.    Allergies  Allergen Reactions   Altace [Ramipril] Cough   Cozaar [Losartan Potassium] Other (See Comments)    Leg pain   Elemental Sulfur Rash    Might have meant (Sulfa??)   Penicillins Hives and Rash    Has patient had a PCN reaction causing immediate rash, facial/tongue/throat swelling, SOB or lightheadedness with hypotension: Yes Has patient had a PCN reaction causing severe rash involving mucus membranes or skin necrosis: No Has patient had a PCN reaction that required hospitalization No Has patient had a PCN reaction occurring within the last 10 years: No If all of the above answers are "NO", then may proceed with Cephalosporin use.    Sulfa Antibiotics Rash    Allergy from  childhood    Social History   Socioeconomic History   Marital status: Married    Spouse name: Not on file   Number of children: Not on file   Years of education: Not on file   Highest education level: Not on file  Occupational History   Not on file  Tobacco Use   Smoking status: Former   Smokeless tobacco: Never   Tobacco comments:    Quit 1997  Vaping Use   Vaping Use: Never used  Substance and Sexual Activity   Alcohol use: Yes    Alcohol/week: 3.0 standard drinks    Types: 1 Glasses of wine, 1 Cans of beer, 1 Shots of liquor per week    Comment: daily,limited socially   Drug use: No   Sexual activity: Not on file  Other Topics Concern    Not on file  Social History Narrative   Not on file   Social Determinants of Health   Financial Resource Strain: Not on file  Food Insecurity: Not on file  Transportation Needs: Not on file  Physical Activity: Not on file  Stress: Not on file  Social Connections: Not on file  Intimate Partner Violence: Not on file    Family History  Problem Relation Age of Onset   Heart disease Father    Heart failure Father     ROS- All systems are reviewed and negative except as per the HPI above  Physical Exam: Vitals:   08/16/21 1001  BP: (!) 156/80  Pulse: (!) 53  Weight: 112.6 kg  Height: 6' (1.829 m)   Wt Readings from Last 3 Encounters:  08/16/21 112.6 kg  10/06/20 116.9 kg  10/05/19 121 kg    Labs: Lab Results  Component Value Date   NA 137 05/03/2017   K 4.1 05/03/2017   CL 101 05/03/2017   CO2 25 05/03/2017   GLUCOSE 140 (H) 05/03/2017   BUN 14 05/03/2017   CREATININE 0.80 05/03/2017   CALCIUM 8.9 05/03/2017   Lab Results  Component Value Date   INR 1.11 05/03/2017   Lab Results  Component Value Date   CHOL 148 05/04/2017   HDL 44 05/04/2017   LDLCALC 79 05/04/2017   TRIG 126 05/04/2017     GEN- The patient is well appearing, alert and oriented x 3 today.   Head- normocephalic, atraumatic Eyes-  Sclera clear, conjunctiva pink Ears- hearing intact Oropharynx- clear Neck- supple, no JVP Lymph- no cervical lymphadenopathy Lungs- Clear to ausculation bilaterally, normal work of breathing Heart- Regular rate and rhythm, no murmurs, rubs or gallops, PMI not laterally displaced GI- soft, NT, ND, + BS Extremities- no clubbing, cyanosis, or edema MS- no significant deformity or atrophy Skin- no rash or lesion Psych- euthymic mood, full affect Neuro- strength and sensation are intact  EKG-Sinus brady at 51 bpm, PR int 152 ms, qrs int 80 ms, qtc 375 ms     Assessment and Plan:  1. Afib Increase in burden to the point that pt is taking his PIP  flecainide  300 mg flecainide every week. We discussed taking flecainide daily and he is in agreement  He is aware that he will not be able to use PIP flecainide with daily flecainide  He will go om 50 mg daily and return in one week for EKG If this dose keeps afib at bay can stay at his does If no, will increase to 100 mg bid and bring back in one more week for ekg After  the dose is decided will plan on a Lexi myoview and update echo  Stop alcohol use   2. CHA2DS2VASc  of at least 5 Continue xarelto 20 mg daily   3. HTN Stable   Amazing Cowman [facility] [address] [place], [place] [address] [phone]   See back in one week with daily flecainide on board

## 2021-08-17 ENCOUNTER — Other Ambulatory Visit (HOSPITAL_COMMUNITY): Payer: Self-pay | Admitting: *Deleted

## 2021-08-17 MED ORDER — DILTIAZEM HCL 30 MG PO TABS: ORAL_TABLET | ORAL | 1 refills | Status: DC

## 2021-08-23 ENCOUNTER — Other Ambulatory Visit: Payer: Self-pay

## 2021-08-23 ENCOUNTER — Ambulatory Visit (HOSPITAL_COMMUNITY)
Admission: RE | Admit: 2021-08-23 | Discharge: 2021-08-23 | Disposition: A | Discharge status: Home / Self Care | Payer: Medicare Other | Source: Ambulatory Visit | Attending: Nurse Practitioner | Admitting: Nurse Practitioner

## 2021-08-23 VITALS — BP 148/86 | HR 57 | Ht 72.0 in | Wt 238.6 lb

## 2021-08-23 DIAGNOSIS — Z5181 Encounter for therapeutic drug level monitoring: Secondary | ICD-10-CM | POA: Diagnosis not present

## 2021-08-23 DIAGNOSIS — I480 Paroxysmal atrial fibrillation: Secondary | ICD-10-CM

## 2021-08-23 DIAGNOSIS — Z79899 Other long term (current) drug therapy: Secondary | ICD-10-CM | POA: Diagnosis not present

## 2021-08-23 DIAGNOSIS — I4891 Unspecified atrial fibrillation: Secondary | ICD-10-CM | POA: Insufficient documentation

## 2021-08-23 NOTE — Progress Notes (Signed)
Pt is in for EKG after starting flecainide 50 mg bid. as he was having to use  PIP 300 mg flecainide weekly. He had some afib the day or two starting but since then has been staying in SR. So for now will keep at 50 mg bid unless more break thru afib and then will increase to 100 mg bid. I will go ahead and update echo and schedule stress test  at the [facility] to assess  for presence of CAD now on daily flecainide. EKG shows sinus brady at 57 bpm, pr int 168 ms, qrs int 84 and qtc 402 ms. F/u with Dr. Katrinka Blazing 11/28.

## 2021-08-24 ENCOUNTER — Other Ambulatory Visit (HOSPITAL_COMMUNITY): Payer: Self-pay | Admitting: *Deleted

## 2021-08-24 DIAGNOSIS — I480 Paroxysmal atrial fibrillation: Secondary | ICD-10-CM

## 2021-08-28 ENCOUNTER — Other Ambulatory Visit (HOSPITAL_COMMUNITY): Payer: Self-pay | Admitting: *Deleted

## 2021-08-28 ENCOUNTER — Telehealth (HOSPITAL_COMMUNITY): Payer: Self-pay | Admitting: *Deleted

## 2021-08-28 ENCOUNTER — Other Ambulatory Visit (HOSPITAL_COMMUNITY): Payer: Self-pay | Admitting: Nurse Practitioner

## 2021-08-28 DIAGNOSIS — I480 Paroxysmal atrial fibrillation: Secondary | ICD-10-CM

## 2021-08-28 NOTE — Telephone Encounter (Signed)
Patient given detailed instructions per Myocardial Perfusion Study Information Sheet for the test on 09/03/21 at 0800. Patient notified to arrive 15 minutes early and that it is imperative to arrive on time for appointment to keep from having the test rescheduled.  If you need to cancel or reschedule your appointment, please call the office within 24 hours of your appointment. . Patient verbalized understanding.Gerald Baker, Gerald Baker Patient has a copy of instructions

## 2021-09-03 ENCOUNTER — Ambulatory Visit (HOSPITAL_COMMUNITY): Payer: Medicare Other | Attending: Cardiology

## 2021-09-03 ENCOUNTER — Other Ambulatory Visit: Payer: Self-pay

## 2021-09-03 DIAGNOSIS — I480 Paroxysmal atrial fibrillation: Secondary | ICD-10-CM

## 2021-09-03 LAB — MYOCARDIAL PERFUSION IMAGING
LV dias vol: 98 mL (ref 62–150)
LV sys vol: 45 mL
Nuc Stress EF: 53 %
Peak HR: 78 {beats}/min
Rest HR: 52 {beats}/min
Rest Nuclear Isotope Dose: 10.1 mCi
SDS: 0
SRS: 0
SSS: 0
ST Depression (mm): 0 mm
Stress Nuclear Isotope Dose: 30.7 mCi
TID: 0.92

## 2021-09-03 MED ORDER — TECHNETIUM TC 99M TETROFOSMIN IV KIT
10.1000 | PACK | Freq: Once | INTRAVENOUS | Status: AC | PRN
  Administered 2021-09-03: 10.1 via INTRAVENOUS
  Filled 2021-09-03: qty 11

## 2021-09-03 MED ORDER — TECHNETIUM TC 99M TETROFOSMIN IV KIT
30.7000 | PACK | Freq: Once | INTRAVENOUS | Status: AC | PRN
  Administered 2021-09-03: 30.7 via INTRAVENOUS
  Filled 2021-09-03: qty 31

## 2021-09-03 MED ORDER — REGADENOSON 0.4 MG/5ML IV SOLN
0.4000 mg | Freq: Once | INTRAVENOUS | Status: AC
  Administered 2021-09-03: 0.4 mg via INTRAVENOUS

## 2021-09-04 ENCOUNTER — Encounter (HOSPITAL_COMMUNITY): Payer: Self-pay | Admitting: *Deleted

## 2021-09-06 DIAGNOSIS — H40053 Ocular hypertension, bilateral: Secondary | ICD-10-CM | POA: Diagnosis not present

## 2021-09-13 ENCOUNTER — Ambulatory Visit (HOSPITAL_COMMUNITY): Payer: Medicare Other | Attending: Cardiology

## 2021-09-13 ENCOUNTER — Other Ambulatory Visit: Payer: Self-pay

## 2021-09-13 DIAGNOSIS — I480 Paroxysmal atrial fibrillation: Secondary | ICD-10-CM | POA: Diagnosis not present

## 2021-09-13 LAB — ECHOCARDIOGRAM COMPLETE
Area-P 1/2: 3.53 cm2
S' Lateral: 3.1 cm

## 2021-09-13 MED ORDER — PERFLUTREN LIPID MICROSPHERE
1.0000 mL | INTRAVENOUS | Status: AC | PRN
  Administered 2021-09-13: 2 mL via INTRAVENOUS

## 2021-09-14 ENCOUNTER — Encounter (HOSPITAL_COMMUNITY): Payer: Self-pay | Admitting: *Deleted

## 2021-10-15 DIAGNOSIS — I480 Paroxysmal atrial fibrillation: Secondary | ICD-10-CM | POA: Diagnosis not present

## 2021-10-15 DIAGNOSIS — Z7984 Long term (current) use of oral hypoglycemic drugs: Secondary | ICD-10-CM | POA: Diagnosis not present

## 2021-10-15 DIAGNOSIS — E113293 Type 2 diabetes mellitus with mild nonproliferative diabetic retinopathy without macular edema, bilateral: Secondary | ICD-10-CM | POA: Diagnosis not present

## 2021-10-15 DIAGNOSIS — I10 Essential (primary) hypertension: Secondary | ICD-10-CM | POA: Diagnosis not present

## 2021-11-09 ENCOUNTER — Other Ambulatory Visit (HOSPITAL_COMMUNITY): Payer: Self-pay | Admitting: *Deleted

## 2021-11-09 MED ORDER — FLECAINIDE ACETATE 50 MG PO TABS: 50.0000 mg | ORAL_TABLET | Freq: Two times a day (BID) | ORAL | 2 refills | Status: DC

## 2021-11-18 NOTE — Progress Notes (Signed)
Cardiology Office Note:    Date:  11/19/2021   ID:  [name], DOB 1951/06/24, MRN [id number]  PCP:  Lavone Orn, MD  Cardiologist:  Sinclair Grooms, MD   Referring MD: Lavone Orn, MD   No chief complaint on file.   History of Present Illness:    Gerald Baker is a 70 y.o. male with a hx of paroxysmal atrial fibrillation, hypertension, chronic anticoagulation therapy, obesity, and suspected obstructive sleep apnea. H/O neurological event on anticoagulation therapy with Xarelto. Now on Flecainide therapy daily rather than PIP.   He is doing well.  Flecainide 50 mg twice daily is not being used because of increased burden of atrial fibrillation.  He has lost nearly 60 pounds since last being seen.  Atrial fibrillation got worse with increasing physical activity requiring using flecainide twice daily rather than as a pill in the pocket  Past Medical History:  Diagnosis Date   Diabetes mellitus without complication ([place])    Hypertension    Persistent atrial fibrillation ([place])    chads2vasc is 2.  on xarelto   Stroke [facility])     Past Surgical History:  Procedure Laterality Date   CARDIOVERSION N/A 10/19/2015   Procedure: CARDIOVERSION;  Surgeon: Skeet Latch, MD;  Location: [facility] ENDOSCOPY;  Service: Cardiovascular;  Laterality: N/A;   KNEE ARTHROSCOPY      Current Medications: Current Meds  Medication Sig   diltiazem (CARDIZEM) 30 MG tablet Take 1 tablet every 4 hours AS NEEDED for AFIB heart rate >100 as long as top blood pressure >100.   flecainide (TAMBOCOR) 50 MG tablet Take 1 tablet (50 mg total) by mouth 2 (two) times daily.   irbesartan (AVAPRO) 150 MG tablet Take 150 mg by mouth daily.   metFORMIN (GLUCOPHAGE-XR) 500 MG 24 hr tablet Take 1,500 mg by mouth 2 (two) times daily. Pt takes 500 mg 1tab in the morning and 1000 mg 2tab in the evening.   metoprolol (LOPRESSOR) 100 MG tablet Take 1 tablet (100 mg total) by mouth 2 (two) times daily.   ONETOUCH  VERIO test strip SMARTSIG:1 Strip(s) Via Meter 1-2 Times Daily   rosuvastatin (CRESTOR) 5 MG tablet Taking one tablet by mouth once weekly on Thursday   sildenafil (VIAGRA) 50 MG tablet    XARELTO 20 MG TABS tablet Take 20 mg by mouth daily.      Allergies:   Altace [ramipril], Cozaar [losartan potassium], Elemental sulfur, Penicillins, and Sulfa antibiotics   Social History   Socioeconomic History   Marital status: Married    Spouse name: Not on file   Number of children: Not on file   Years of education: Not on file   Highest education level: Not on file  Occupational History   Not on file  Tobacco Use   Smoking status: Former   Smokeless tobacco: Never   Tobacco comments:    Quit 1997  Vaping Use   Vaping Use: Never used  Substance and Sexual Activity   Alcohol use: Yes    Alcohol/week: 3.0 standard drinks    Types: 1 Glasses of wine, 1 Cans of beer, 1 Shots of liquor per week    Comment: daily,limited socially   Drug use: No   Sexual activity: Not on file  Other Topics Concern   Not on file  Social History Narrative   Not on file   Social Determinants of Health   Financial Resource Strain: Not on file  Food Insecurity: Not on file  Transportation Needs: Not on file  Physical Activity: Not on file  Stress: Not on file  Social Connections: Not on file     Family History: The patient's family history includes Heart disease in his father; Heart failure in his father.  ROS:   Please see the history of present illness.    Happy with his exertional tolerance.  All other systems reviewed and are negative.  EKGs/Labs/Other Studies Reviewed:    The following studies were reviewed today:  2D Doppler ECHOCARDIOGRAM 2022: IMPRESSIONS     1. Left ventricular ejection fraction, by estimation, is 55 to 60%. The  left ventricle has normal function. The left ventricle has no regional  wall motion abnormalities. Left ventricular diastolic parameters are   indeterminate.   2. Right ventricular systolic function is low normal. The right  ventricular size is mildly enlarged. There is mildly elevated pulmonary  artery systolic pressure. The estimated right ventricular systolic  pressure is 123XX123 mmHg.   3. Left atrial size was mildly dilated.   4. Right atrial size was mildly dilated.   5. The mitral valve is normal in structure. Trivial mitral valve  regurgitation. No evidence of mitral stenosis.   6. The aortic valve is grossly normal. Aortic valve regurgitation is not  visualized. No aortic stenosis is present.   Comparison(s): No significant change from prior study.   EKG:  EKG no  Recent Labs: No results found for requested labs within last 8760 hours.  Recent Lipid Panel    Component Value Date/Time   CHOL 148 05/04/2017 0528   TRIG 126 05/04/2017 0528   HDL 44 05/04/2017 0528   CHOLHDL 3.4 05/04/2017 0528   VLDL 25 05/04/2017 0528   LDLCALC 79 05/04/2017 0528    Physical Exam:    VS:  BP 134/74   Pulse (!) 56   Ht 6' (1.829 m)   Wt 239 lb 3.2 oz (108.5 kg)   SpO2 97%   BMI 32.44 kg/m     Wt Readings from Last 3 Encounters:  11/19/21 239 lb 3.2 oz (108.5 kg)  09/03/21 238 lb (108 kg)  08/23/21 238 lb 9.6 oz (108.2 kg)     GEN: Moderate obesity. No acute distress HEENT: Normal NECK: No JVD. LYMPHATICS: No lymphadenopathy CARDIAC: No murmur. RRR no gallop, or edema. VASCULAR:  Normal Pulses. No bruits. RESPIRATORY:  Clear to auscultation without rales, wheezing or rhonchi  ABDOMEN: Soft, non-tender, non-distended, No pulsatile mass, MUSCULOSKELETAL: No deformity  SKIN: Warm and dry NEUROLOGIC:  Alert and oriented x 3 PSYCHIATRIC:  Normal affect   ASSESSMENT:    1. Paroxysmal atrial fibrillation (HCC)   2. Secondary hypercoagulable state ([place])   3. History of TIA (transient ischemic attack)   4. Essential hypertension   5. Dyslipidemia   6. Chronic diastolic heart failure ([place])   7. Chronic  anticoagulation    PLAN:    In order of problems listed above:  Stable rhythm consistent with sinus on flecainide 50 mg twice daily.  Continue same therapy. Continue rivaroxaban 20 mg/day. No recurrence Blood pressure control is excellent on current therapy which includes diltiazem, Avapro, and we should consider SGLT2 therapy. Continue with Crestor if tolerable.  He is down to 5 mg once a day.  Most recent LDL was 40.  May have to forego therapy or switch to [place] of bempedoic acid. No evidence of volume overload   Follow-up in 1 year.   Medication Adjustments/Labs and Tests Ordered: Current medicines are reviewed  at length with the patient today.  Concerns regarding medicines are outlined above.  No orders of the defined types were placed in this encounter.  No orders of the defined types were placed in this encounter.   Patient Instructions  Medication Instructions:  Your physician recommends that you continue on your current medications as directed. Please refer to the Current Medication list given to you today.  *If you need a refill on your cardiac medications before your next appointment, please call your pharmacy*   Lab Work: None If you have labs (blood work) drawn today and your tests are completely normal, you will receive your results only by: MyChart Message (if you have MyChart) OR A paper copy in the mail If you have any lab test that is abnormal or we need to change your treatment, we will call you to review the results.   Testing/Procedures: None   Follow-Up: At [facility], you and your health needs are our priority.  As part of our continuing mission to provide you with exceptional heart care, we have created designated Provider Care Teams.  These Care Teams include your primary Cardiologist (physician) and Advanced Practice Providers (APPs -  Physician Assistants and Nurse Practitioners) who all work together to provide you with the care you need,  when you need it.  We recommend signing up for the patient portal called "MyChart".  Sign up information is provided on this After Visit Summary.  MyChart is used to connect with patients for Virtual Visits (Telemedicine).  Patients are able to view lab/test results, encounter notes, upcoming appointments, etc.  Non-urgent messages can be sent to your provider as well.   To learn more about what you can do with MyChart, go to [email or web address].    Your next appointment:   1 year(s)  The format for your next appointment:   In Person  Provider:   Lesleigh Noe, MD     Other Instructions     Signed, Lesleigh Noe, MD  11/19/2021 2:06 PM    [facility]

## 2021-11-19 ENCOUNTER — Encounter: Payer: Self-pay | Admitting: Interventional Cardiology

## 2021-11-19 ENCOUNTER — Other Ambulatory Visit: Payer: Self-pay

## 2021-11-19 ENCOUNTER — Ambulatory Visit (INDEPENDENT_AMBULATORY_CARE_PROVIDER_SITE_OTHER): Payer: Medicare Other | Admitting: Interventional Cardiology

## 2021-11-19 VITALS — BP 134/74 | HR 56 | Ht 72.0 in | Wt 239.2 lb

## 2021-11-19 DIAGNOSIS — D6869 Other thrombophilia: Secondary | ICD-10-CM | POA: Diagnosis not present

## 2021-11-19 DIAGNOSIS — Z8673 Personal history of transient ischemic attack (TIA), and cerebral infarction without residual deficits: Secondary | ICD-10-CM | POA: Diagnosis not present

## 2021-11-19 DIAGNOSIS — Z7901 Long term (current) use of anticoagulants: Secondary | ICD-10-CM

## 2021-11-19 DIAGNOSIS — I5032 Chronic diastolic (congestive) heart failure: Secondary | ICD-10-CM

## 2021-11-19 DIAGNOSIS — E785 Hyperlipidemia, unspecified: Secondary | ICD-10-CM

## 2021-11-19 DIAGNOSIS — I480 Paroxysmal atrial fibrillation: Secondary | ICD-10-CM

## 2021-11-19 DIAGNOSIS — I10 Essential (primary) hypertension: Secondary | ICD-10-CM | POA: Diagnosis not present

## 2021-11-19 NOTE — Patient Instructions (Signed)

## 2022-02-15 DIAGNOSIS — I10 Essential (primary) hypertension: Secondary | ICD-10-CM | POA: Diagnosis not present

## 2022-02-15 DIAGNOSIS — Z0000 Encounter for general adult medical examination without abnormal findings: Secondary | ICD-10-CM | POA: Diagnosis not present

## 2022-02-15 DIAGNOSIS — Z125 Encounter for screening for malignant neoplasm of prostate: Secondary | ICD-10-CM | POA: Diagnosis not present

## 2022-02-15 DIAGNOSIS — Z1389 Encounter for screening for other disorder: Secondary | ICD-10-CM | POA: Diagnosis not present

## 2022-02-15 DIAGNOSIS — E113293 Type 2 diabetes mellitus with mild nonproliferative diabetic retinopathy without macular edema, bilateral: Secondary | ICD-10-CM | POA: Diagnosis not present

## 2022-02-15 DIAGNOSIS — I480 Paroxysmal atrial fibrillation: Secondary | ICD-10-CM | POA: Diagnosis not present

## 2022-02-15 DIAGNOSIS — D6869 Other thrombophilia: Secondary | ICD-10-CM | POA: Diagnosis not present

## 2022-03-22 DIAGNOSIS — H5213 Myopia, bilateral: Secondary | ICD-10-CM | POA: Diagnosis not present

## 2022-04-01 DIAGNOSIS — L814 Other melanin hyperpigmentation: Secondary | ICD-10-CM | POA: Diagnosis not present

## 2022-04-01 DIAGNOSIS — C4442 Squamous cell carcinoma of skin of scalp and neck: Secondary | ICD-10-CM | POA: Diagnosis not present

## 2022-04-01 DIAGNOSIS — L821 Other seborrheic keratosis: Secondary | ICD-10-CM | POA: Diagnosis not present

## 2022-04-01 DIAGNOSIS — D2372 Other benign neoplasm of skin of left lower limb, including hip: Secondary | ICD-10-CM | POA: Diagnosis not present

## 2022-04-01 DIAGNOSIS — Z08 Encounter for follow-up examination after completed treatment for malignant neoplasm: Secondary | ICD-10-CM | POA: Diagnosis not present

## 2022-04-29 DIAGNOSIS — L298 Other pruritus: Secondary | ICD-10-CM | POA: Diagnosis not present

## 2022-04-29 DIAGNOSIS — Z08 Encounter for follow-up examination after completed treatment for malignant neoplasm: Secondary | ICD-10-CM | POA: Diagnosis not present

## 2022-04-29 DIAGNOSIS — C4442 Squamous cell carcinoma of skin of scalp and neck: Secondary | ICD-10-CM | POA: Diagnosis not present

## 2022-04-29 DIAGNOSIS — Z85828 Personal history of other malignant neoplasm of skin: Secondary | ICD-10-CM | POA: Diagnosis not present

## 2022-05-30 DIAGNOSIS — C44529 Squamous cell carcinoma of skin of other part of trunk: Secondary | ICD-10-CM | POA: Diagnosis not present

## 2022-05-30 DIAGNOSIS — D045 Carcinoma in situ of skin of trunk: Secondary | ICD-10-CM | POA: Diagnosis not present

## 2022-06-20 ENCOUNTER — Other Ambulatory Visit (HOSPITAL_COMMUNITY): Payer: Self-pay | Admitting: *Deleted

## 2022-06-20 MED ORDER — FLECAINIDE ACETATE 50 MG PO TABS: 50.0000 mg | ORAL_TABLET | Freq: Two times a day (BID) | ORAL | 1 refills | Status: DC

## 2022-06-26 ENCOUNTER — Encounter (HOSPITAL_COMMUNITY): Payer: Self-pay | Admitting: Nurse Practitioner

## 2022-06-26 ENCOUNTER — Ambulatory Visit (HOSPITAL_COMMUNITY)
Admission: RE | Admit: 2022-06-26 | Discharge: 2022-06-26 | Disposition: A | Discharge status: Home / Self Care | Payer: Medicare Other | Source: Ambulatory Visit | Attending: Nurse Practitioner | Admitting: Nurse Practitioner

## 2022-06-26 VITALS — BP 156/94 | HR 48 | Ht 72.0 in | Wt 242.4 lb

## 2022-06-26 DIAGNOSIS — D6869 Other thrombophilia: Secondary | ICD-10-CM | POA: Diagnosis not present

## 2022-06-26 DIAGNOSIS — Z79899 Other long term (current) drug therapy: Secondary | ICD-10-CM | POA: Insufficient documentation

## 2022-06-26 DIAGNOSIS — I10 Essential (primary) hypertension: Secondary | ICD-10-CM | POA: Diagnosis not present

## 2022-06-26 DIAGNOSIS — Z7901 Long term (current) use of anticoagulants: Secondary | ICD-10-CM | POA: Diagnosis not present

## 2022-06-26 DIAGNOSIS — I480 Paroxysmal atrial fibrillation: Secondary | ICD-10-CM | POA: Insufficient documentation

## 2022-06-26 NOTE — Addendum Note (Signed)
Encounter addended by: Learta Codding, CMA on: 06/26/2022 2:25 PM  Actions taken: Order list changed

## 2022-06-26 NOTE — Progress Notes (Signed)
Primary Care Physician: Kirby Funk, MD Referring Physician: self referred Cardiologist: Dr. Ala Dach Gerald Baker is a 71 y.o. male with a h/o paroxysmal afib , HTN, mild LVH, that is in the afib clinic for increase of afib since the first of the year. I has continuously increased over the last several weeks and now is taking his pill in pocket 300 mg flecainide at lest once a week. It will quickly convert him to SR. I have not seen since 2017. Since then he has been following [employer] off and on and has lost weight. He is trying to be active and tries to walk daily. He is drinking alcohol but has cut back since I saw him last. He watches caffeine intake. He had a sleep study in the past but did not need CPAP. His Fitbit monitors his O2 during the night and he does not drop below 95%. He has not had a stress test but walks vigorously  and does not have any chest discomfort or untoward shortness of breath. CHA2DS2VASc  score of 5 and is on xarelto 20 mg daily.   F/u in afib clinic, 06/26/22. He has been on daily flecainide at 50 mg bid since last September ( previously PIP flecainide approach)  and it was  working well to stay in SR  but since Christmas, he has had an episode of afib once every 3 months. He will take an extra 150 to 200 mg flecainide and convert. He is Sinus brady today  with usual v rates in the 50's at home, 48 bpm here but not symptomatic.   Today, he denies symptoms of palpitations, chest pain, shortness of breath, orthopnea, PND, lower extremity edema, dizziness, presyncope, syncope, or neurologic sequela. The patient is tolerating medications without difficulties and is otherwise without complaint today.   Past Medical History:  Diagnosis Date   Diabetes mellitus without complication (HCC)    Hypertension    Persistent atrial fibrillation (HCC)    chads2vasc is 2.  on xarelto   Stroke [facility])    Past Surgical History:  Procedure Laterality Date   CARDIOVERSION N/A  10/19/2015   Procedure: CARDIOVERSION;  Surgeon: Chilton Si, MD;  Location: [facility] ENDOSCOPY;  Service: Cardiovascular;  Laterality: N/A;   KNEE ARTHROSCOPY      Current Outpatient Medications  Medication Sig Dispense Refill   Ciclopirox 1 % shampoo Apply topically as needed.     diltiazem (CARDIZEM) 30 MG tablet Take 1 tablet every 4 hours AS NEEDED for AFIB heart rate >100 as long as top blood pressure >100. 45 tablet 1   flecainide (TAMBOCOR) 50 MG tablet Take 1 tablet (50 mg total) by mouth 2 (two) times daily. 180 tablet 1   irbesartan (AVAPRO) 150 MG tablet Take 150 mg by mouth daily.     metFORMIN (GLUCOPHAGE-XR) 500 MG 24 hr tablet Pt takes 500 mg 1tab in the morning and 1000 mg in the evening     metoprolol (LOPRESSOR) 100 MG tablet Take 1 tablet (100 mg total) by mouth 2 (two) times daily.     ONETOUCH VERIO test strip SMARTSIG:1 Strip(s) Via Meter 1-2 Times Daily     sildenafil (VIAGRA) 50 MG tablet Take 50 mg by mouth as needed.     XARELTO 20 MG TABS tablet Take 20 mg by mouth daily.      No current facility-administered medications for this encounter.    Allergies  Allergen Reactions   Altace [Ramipril] Cough  Cozaar [Losartan Potassium] Other (See Comments)    Leg pain   Rosuvastatin Other (See Comments)    Causes legs to ache   Elemental Sulfur Rash    Might have meant (Sulfa??)   Penicillins Hives and Rash    Has patient had a PCN reaction causing immediate rash, facial/tongue/throat swelling, SOB or lightheadedness with hypotension: Yes Has patient had a PCN reaction causing severe rash involving mucus membranes or skin necrosis: No Has patient had a PCN reaction that required hospitalization No Has patient had a PCN reaction occurring within the last 10 years: No If all of the above answers are "NO", then may proceed with Cephalosporin use.    Sulfa Antibiotics Rash    Allergy from childhood    Social History   Socioeconomic History   Marital status:  Married    Spouse name: Not on file   Number of children: Not on file   Years of education: Not on file   Highest education level: Not on file  Occupational History   Not on file  Tobacco Use   Smoking status: Former   Smokeless tobacco: Never   Tobacco comments:    Quit 1997  Vaping Use   Vaping Use: Never used  Substance and Sexual Activity   Alcohol use: Yes    Alcohol/week: 3.0 standard drinks of alcohol    Types: 1 Glasses of wine, 1 Cans of beer, 1 Shots of liquor per week    Comment: daily,limited socially   Drug use: No   Sexual activity: Not on file  Other Topics Concern   Not on file  Social History Narrative   Not on file   Social Determinants of Health   Financial Resource Strain: Not on file  Food Insecurity: Not on file  Transportation Needs: Not on file  Physical Activity: Not on file  Stress: Not on file  Social Connections: Not on file  Intimate Partner Violence: Not on file    Family History  Problem Relation Age of Onset   Heart disease Father    Heart failure Father     ROS- All systems are reviewed and negative except as per the HPI above  Physical Exam: Vitals:   06/26/22 1101  BP: (!) 156/94  Pulse: (!) 48  Weight: 110 kg  Height: 6' (1.829 m)   Wt Readings from Last 3 Encounters:  06/26/22 110 kg  11/19/21 108.5 kg  09/03/21 108 kg    Labs: Lab Results  Component Value Date   NA 137 05/03/2017   K 4.1 05/03/2017   CL 101 05/03/2017   CO2 25 05/03/2017   GLUCOSE 140 (H) 05/03/2017   BUN 14 05/03/2017   CREATININE 0.80 05/03/2017   CALCIUM 8.9 05/03/2017   Lab Results  Component Value Date   INR 1.11 05/03/2017   Lab Results  Component Value Date   CHOL 148 05/04/2017   HDL 44 05/04/2017   LDLCALC 79 05/04/2017   TRIG 126 05/04/2017     GEN- The patient is well appearing, alert and oriented x 3 today.   Head- normocephalic, atraumatic Eyes-  Sclera clear, conjunctiva pink Ears- hearing intact Oropharynx-  clear Neck- supple, no JVP Lymph- no cervical lymphadenopathy Lungs- Clear to ausculation bilaterally, normal work of breathing Heart- Regular rate and rhythm, no murmurs, rubs or gallops, PMI not laterally displaced GI- soft, NT, ND, + BS Extremities- no clubbing, cyanosis, or edema MS- no significant deformity or atrophy Skin- no rash or lesion Psych-  euthymic mood, full affect Neuro- strength and sensation are intact  EKG- Vent. rate 48 BPM PR interval 172 ms QRS duration 82 ms QT/QTcB 414/369 ms P-R-T axes 31 45 56 Sinus bradycardia Otherwise normal ECG When compared with ECG of 23-Aug-2021 11:50, PREVIOUS ECG IS PRESENT    Assessment and Plan:  1. Afib Increase in burden  since daily flecainide last fall. I told him to be carefull taking extra flecainide, try to stay at 100 mg total if goes into afib in the am and pm He will continue on flecainide 50 mg bid   We also discussed ablation today and he is interested to hear more   2. CHA2DS2VASc  of at least 5 Continue xarelto 20 mg daily   3. HTN Elevated here today, but at home BP readings are controlled   To EP to discuss ablation   Butch Penny C. Lorey Pallett, [facility] [address] [place], [place] [address] [phone]

## 2022-06-27 NOTE — Addendum Note (Signed)
Encounter addended by: Newman Nip, NP on: 06/27/2022 8:32 AM  Actions taken: Clinical Note Signed

## 2022-07-30 DIAGNOSIS — I5032 Chronic diastolic (congestive) heart failure: Secondary | ICD-10-CM | POA: Diagnosis not present

## 2022-07-30 DIAGNOSIS — I10 Essential (primary) hypertension: Secondary | ICD-10-CM | POA: Diagnosis not present

## 2022-07-30 DIAGNOSIS — I480 Paroxysmal atrial fibrillation: Secondary | ICD-10-CM | POA: Diagnosis not present

## 2022-07-30 DIAGNOSIS — E1169 Type 2 diabetes mellitus with other specified complication: Secondary | ICD-10-CM | POA: Diagnosis not present

## 2022-08-16 ENCOUNTER — Encounter: Payer: Self-pay | Admitting: Cardiology

## 2022-08-16 ENCOUNTER — Encounter: Payer: Self-pay | Admitting: *Deleted

## 2022-08-16 ENCOUNTER — Ambulatory Visit: Payer: Medicare Other | Admitting: Cardiology

## 2022-08-16 VITALS — BP 142/82 | HR 58 | Ht 72.0 in | Wt 246.0 lb

## 2022-08-16 DIAGNOSIS — I480 Paroxysmal atrial fibrillation: Secondary | ICD-10-CM | POA: Diagnosis not present

## 2022-08-16 DIAGNOSIS — D6869 Other thrombophilia: Secondary | ICD-10-CM | POA: Diagnosis not present

## 2022-08-16 DIAGNOSIS — I4819 Other persistent atrial fibrillation: Secondary | ICD-10-CM

## 2022-08-16 DIAGNOSIS — Z01812 Encounter for preprocedural laboratory examination: Secondary | ICD-10-CM | POA: Diagnosis not present

## 2022-08-16 NOTE — Progress Notes (Signed)
Electrophysiology Office Note   Date:  08/16/2022   ID:  Jacon, Whetzel 1951-04-01, MRN [id number]  PCP:  Kirby Funk, MD  Cardiologist:  Katrinka Blazing Primary Electrophysiologist:  Michae Grimley [name], MD    Chief Complaint: AF   History of Present Illness: Gerald Baker is a 71 y.o. male who is being seen today for the evaluation of AF at the request of Newman Nip, NP. Presenting today for electrophysiology evaluation.  Is a history significant paroxysmal atrial fibrillation, hypertension, mild LVH.  He has been having more frequent episodes of atrial fibrillation.  He was initially on pill in the pocket flecainide and was taking it on a weekly basis.  He is trying to be active, walking daily.  He is cut back on his alcohol.  He did have a sleep study but did not need CPAP.  He is now on daily flecainide at 50 mg twice daily.  He has been having an episode of atrial fibrillation once every 3 months.  He Greg Eckrich take an extra 150 to 200 mg of flecainide and converted to sinus rhythm.  As he is having more frequent episodes of atrial fibrillation, he would like an alternative rhythm control strategy.  Today, he denies symptoms of palpitations, chest pain, shortness of breath, orthopnea, PND, lower extremity edema, claudication, dizziness, presyncope, syncope, bleeding, or neurologic sequela. The patient is tolerating medications without difficulties.    Past Medical History:  Diagnosis Date   Diabetes mellitus without complication (HCC)    Hypertension    Persistent atrial fibrillation (HCC)    chads2vasc is 2.  on xarelto   Stroke [facility])    Past Surgical History:  Procedure Laterality Date   CARDIOVERSION N/A 10/19/2015   Procedure: CARDIOVERSION;  Surgeon: Chilton Si, MD;  Location: [facility] ENDOSCOPY;  Service: Cardiovascular;  Laterality: N/A;   KNEE ARTHROSCOPY       Current Outpatient Medications  Medication Sig Dispense Refill   Ciclopirox 1 % shampoo Apply  topically as needed.     diltiazem (CARDIZEM) 30 MG tablet Take 1 tablet every 4 hours AS NEEDED for AFIB heart rate >100 as long as top blood pressure >100. 45 tablet 1   flecainide (TAMBOCOR) 50 MG tablet Take 1 tablet (50 mg total) by mouth 2 (two) times daily. 180 tablet 1   irbesartan (AVAPRO) 150 MG tablet Take 150 mg by mouth daily.     metFORMIN (GLUCOPHAGE-XR) 500 MG 24 hr tablet Pt takes 500 mg 1tab in the morning and 1000 mg in the evening     metoprolol (LOPRESSOR) 100 MG tablet Take 1 tablet (100 mg total) by mouth 2 (two) times daily.     ONETOUCH VERIO test strip SMARTSIG:1 Strip(s) Via Meter 1-2 Times Daily     sildenafil (VIAGRA) 50 MG tablet Take 50 mg by mouth as needed.     XARELTO 20 MG TABS tablet Take 20 mg by mouth daily.      No current facility-administered medications for this visit.    Allergies:   Altace [ramipril], Cozaar [losartan potassium], Rosuvastatin, Elemental sulfur, Penicillins, and Sulfa antibiotics   Social History:  The patient  reports that he has quit smoking. He has never used smokeless tobacco. He reports current alcohol use of about 3.0 standard drinks of alcohol per week. He reports that he does not use drugs.   Family History:  The patient's family history includes Heart disease in his father; Heart failure in his father.  ROS:  Please see the history of present illness.   Otherwise, review of systems is positive for none.   All other systems are reviewed and negative.    PHYSICAL EXAM: VS:  BP (!) 142/82   Pulse (!) 58   Ht 6' (1.829 m)   Wt 246 lb (111.6 kg)   SpO2 98%   BMI 33.36 kg/m  , BMI Body mass index is 33.36 kg/m. GEN: Well nourished, well developed, in no acute distress  HEENT: normal  Neck: no JVD, carotid bruits, or masses Cardiac: RRR; no murmurs, rubs, or gallops,no edema  Respiratory:  clear to auscultation bilaterally, normal work of breathing GI: soft, nontender, nondistended, + BS MS: no deformity or atrophy   Skin: warm and dry Neuro:  Strength and sensation are intact Psych: euthymic mood, full affect  EKG:  EKG is ordered today. Personal review of the ekg ordered shows sinus rhythm rate 57  Recent Labs: No results found for requested labs within last 365 days.    Lipid Panel     Component Value Date/Time   CHOL 148 05/04/2017 0528   TRIG 126 05/04/2017 0528   HDL 44 05/04/2017 0528   CHOLHDL 3.4 05/04/2017 0528   VLDL 25 05/04/2017 0528   LDLCALC 79 05/04/2017 0528     Wt Readings from Last 3 Encounters:  08/16/22 246 lb (111.6 kg)  06/26/22 242 lb 6.4 oz (110 kg)  11/19/21 239 lb 3.2 oz (108.5 kg)      Other studies Reviewed: Additional studies/ records that were reviewed today include: TTE 09/13/21  Review of the above records today demonstrates:   1. Left ventricular ejection fraction, by estimation, is 55 to 60%. The  left ventricle has normal function. The left ventricle has no regional  wall motion abnormalities. Left ventricular diastolic parameters are  indeterminate.   2. Right ventricular systolic function is low normal. The right  ventricular size is mildly enlarged. There is mildly elevated pulmonary  artery systolic pressure. The estimated right ventricular systolic  pressure is 36.7 mmHg.   3. Left atrial size was mildly dilated.   4. Right atrial size was mildly dilated.   5. The mitral valve is normal in structure. Trivial mitral valve  regurgitation. No evidence of mitral stenosis.   6. The aortic valve is grossly normal. Aortic valve regurgitation is not  visualized. No aortic stenosis is present.    ASSESSMENT AND PLAN:  1.  Paroxysmal atrial fibrillation: Currently on flecainide 50 mg twice daily.  CHA2DS2-VASc of 5.  Also on Xarelto 20 mg daily.  High risk medication monitoring for flecainide.  Later potentially get off of his flecainide.  I offered either increased dose or ablation.  He would prefer ablation.  Risk, benefits, and alternatives  to EP study and radiofrequency ablation for afib were also discussed in detail today. These risks include but are not limited to stroke, bleeding, vascular damage, tamponade, perforation, damage to the esophagus, lungs, and other structures, pulmonary vein stenosis, worsening renal function, and death. The patient understands these risk and wishes to proceed.  We Sophie Tamez therefore proceed with catheter ablation at the next available time.  Carto, ICE, anesthesia are requested for the procedure.  Anginette Espejo also obtain CT PV protocol prior to the procedure to exclude LAA thrombus and further evaluate atrial anatomy.   2.  Hypertension: Well-controlled  3.  Secondary to coagula state: Currently on Xarelto for atrial fibrillation as above    Current medicines are reviewed at  length with the patient today.   The patient does not have concerns regarding his medicines.  The following changes were made today:  none  Labs/ tests ordered today include:  Orders Placed This Encounter  Procedures   CT CARDIAC MORPH/PULM VEIN W/CM&W/O CA SCORE   Basic metabolic panel   CBC   EKG 12-Lead     Disposition:   FU with Annarae Macnair 3 months  Signed, Debra Calabretta [name], MD  08/16/2022 10:57 AM     [facility] [address] [place] [address] [phone] (office) [phone] (fax)

## 2022-08-16 NOTE — Patient Instructions (Signed)
Medication Instructions:  Your physician recommends that you continue on your current medications as directed. Please refer to the Current Medication list given to you today.  *If you need a refill on your cardiac medications before your next appointment, please call your pharmacy*   Lab Work: Pre procedure labs -- see procedure instruction letter:  BMP & CBC  If you have labs (blood work) drawn today and your tests are completely normal, you will receive your results only by: MyChart Message (if you have MyChart) OR A paper copy in the mail If you have any lab test that is abnormal or we need to change your treatment, we will call you to review the results.   Testing/Procedures: Your physician has requested that you have cardiac CT within 7 days PRIOR to your ablation. Cardiac computed tomography (CT) is a painless test that uses an x-ray machine to take clear, detailed pictures of your heart.  Please follow instruction below located under "other instructions". You will get a call from our office to schedule the date for this test.  Your physician has recommended that you have an ablation. Catheter ablation is a medical procedure used to treat some cardiac arrhythmias (irregular heartbeats). During catheter ablation, a long, thin, flexible tube is put into a blood vessel in your groin (upper thigh), or neck. This tube is called an ablation catheter. It is then guided to your heart through the blood vessel. Radio frequency waves destroy small areas of heart tissue where abnormal heartbeats may cause an arrhythmia to start. Please follow instruction letter given to you today.   Follow-Up: At [facility], you and your health needs are our priority.  As part of our continuing mission to provide you with exceptional heart care, we have created designated Provider Care Teams.  These Care Teams include your primary Cardiologist (physician) and Advanced Practice Providers (APPs -  Physician  Assistants and Nurse Practitioners) who all work together to provide you with the care you need, when you need it.  We recommend signing up for the patient portal called "MyChart".  Sign up information is provided on this After Visit Summary.  MyChart is used to connect with patients for Virtual Visits (Telemedicine).  Patients are able to view lab/test results, encounter notes, upcoming appointments, etc.  Non-urgent messages can be sent to your provider as well.   To learn more about what you can do with MyChart, go to [email or web address].    Your next appointment:   1 month(s) after your ablation  The format for your next appointment:   In Person  Provider:   AFib clinic   Thank you for choosing [facility]!!   Bonham Zingale, RN [phone]    Other Instructions   Cardiac Ablation Cardiac ablation is a procedure to destroy (ablate) some heart tissue that is sending bad signals. These bad signals cause problems in heart rhythm. The heart has many areas that make these signals. If there are problems in these areas, they can make the heart beat in a way that is not normal. Destroying some tissues can help make the heart rhythm normal. Tell your doctor about: Any allergies you have. All medicines you are taking. These include vitamins, herbs, eye drops, creams, and over-the-counter medicines. Any problems you or family members have had with medicines that make you fall asleep (anesthetics). Any blood disorders you have. Any surgeries you have had. Any medical conditions you have, such as kidney failure. Whether you are pregnant or may be   pregnant. What are the risks? This is a safe procedure. But problems may occur, including: Infection. Bruising and bleeding. Bleeding into the chest. Stroke or blood clots. Damage to nearby areas of your body. Allergies to medicines or dyes. The need for a pacemaker if the normal system is damaged. Failure of the procedure to  treat the problem. What happens before the procedure? Medicines Ask your doctor about: Changing or stopping your normal medicines. This is important. Taking aspirin and ibuprofen. Do not take these medicines unless your doctor tells you to take them. Taking other medicines, vitamins, herbs, and supplements. General instructions Follow instructions from your doctor about what you cannot eat or drink. Plan to have someone take you home from the hospital or clinic. If you will be going home right after the procedure, plan to have someone with you for 24 hours. Ask your doctor what steps will be taken to prevent infection. What happens during the procedure?  An IV tube will be put into one of your veins. You will be given a medicine to help you relax. The skin on your neck or groin will be numbed. A cut (incision) will be made in your neck or groin. A needle will be put through your cut and into a large vein. A tube (catheter) will be put into the needle. The tube will be moved to your heart. Dye may be put through the tube. This helps your doctor see your heart. Small devices (electrodes) on the tube will send out signals. A type of energy will be used to destroy some heart tissue. The tube will be taken out. Pressure will be held on your cut. This helps stop bleeding. A bandage will be put over your cut. The exact procedure may vary among doctors and hospitals. What happens after the procedure? You will be watched until you leave the hospital or clinic. This includes checking your heart rate, breathing rate, oxygen, and blood pressure. Your cut will be watched for bleeding. You will need to lie still for a few hours. Do not drive for 24 hours or as long as your doctor tells you. Summary Cardiac ablation is a procedure to destroy some heart tissue. This is done to treat heart rhythm problems. Tell your doctor about any medical conditions you may have. Tell him or her about all medicines  you are taking to treat them. This is a safe procedure. But problems may occur. These include infection, bruising, bleeding, and damage to nearby areas of your body. Follow what your doctor tells you about food and drink. You may also be told to change or stop some of your medicines. After the procedure, do not drive for 24 hours or as long as your doctor tells you. This information is not intended to replace advice given to you by your health care provider. Make sure you discuss any questions you have with your health care provider. Document Revised: 03/01/2022 Document Reviewed: 11/11/2019 Elsevier Patient Education  2023 Elsevier Inc.   

## 2022-08-29 DIAGNOSIS — L814 Other melanin hyperpigmentation: Secondary | ICD-10-CM | POA: Diagnosis not present

## 2022-08-29 DIAGNOSIS — D225 Melanocytic nevi of trunk: Secondary | ICD-10-CM | POA: Diagnosis not present

## 2022-08-29 DIAGNOSIS — Z08 Encounter for follow-up examination after completed treatment for malignant neoplasm: Secondary | ICD-10-CM | POA: Diagnosis not present

## 2022-08-29 DIAGNOSIS — L821 Other seborrheic keratosis: Secondary | ICD-10-CM | POA: Diagnosis not present

## 2022-08-29 DIAGNOSIS — C4441 Basal cell carcinoma of skin of scalp and neck: Secondary | ICD-10-CM | POA: Diagnosis not present

## 2022-10-17 DIAGNOSIS — C4441 Basal cell carcinoma of skin of scalp and neck: Secondary | ICD-10-CM | POA: Diagnosis not present

## 2022-12-03 NOTE — H&P (View-Only) (Signed)
Cardiology Office Note Date:  12/05/2022  Patient ID:  [name], [name] 1951-07-01, MRN [id number] PCP:  Emilio Aspen, MD  Cardiologist:  Dr. Katrinka Blazing Electrophysiologist: Dr. Elberta Fortis    Chief Complaint: pre-op AF ablation   History of Present Illness: CORI HENNINGSEN is a 71 y.o. male with history of HTN, HLD, DM, TIA, AFib  He saw Dr. Katrinka Blazing 11/19/21, had lost weight, had an up-tick in AF burden requiring a change from pill in the pocket strategy to daily flecainide.  He saw Dr. Elberta Fortis 08/16/22, discussed strategies for AFib management and planned to pursue an ablation, Scheduled 01/02/23.  He continues to have intermittent palpitation episodes, couple times in last 2-3 months but nothing in last 6 weeks. Episodes becoming longer and more often. No CP, pressure with episodes. Weight stable, though admits to gaining about 10lbs the past year after losing 25. No Edema. No SOB or DOE.   Had a sleep study that did not show OSA. He is anxious about upcoming procedure, has talked to friends, and does wish to proceed.  He is very diligent about xarelto, no concerns with bleeding.   AFib/AAD hx Diagnosed 2012  Flecainide started 2017 with PRN use >> scheduled med in Aug 2022   Past Medical History:  Diagnosis Date   Diabetes mellitus without complication (HCC)    Hypertension    Persistent atrial fibrillation (HCC)    chads2vasc is 2.  on xarelto   Stroke [facility])     Past Surgical History:  Procedure Laterality Date   CARDIOVERSION N/A 10/19/2015   Procedure: CARDIOVERSION;  Surgeon: Chilton Si, MD;  Location: [facility];  Service: Cardiovascular;  Laterality: N/A;   KNEE ARTHROSCOPY      Current Outpatient Medications  Medication Sig Dispense Refill   Ciclopirox 1 % shampoo Apply topically as needed.     diltiazem (CARDIZEM) 30 MG tablet Take 1 tablet every 4 hours AS NEEDED for AFIB heart rate >100 as long as top blood pressure >100. 45 tablet 1    flecainide (TAMBOCOR) 50 MG tablet Take 1 tablet (50 mg total) by mouth 2 (two) times daily. 180 tablet 1   irbesartan (AVAPRO) 150 MG tablet Take 150 mg by mouth daily.     metFORMIN (GLUCOPHAGE-XR) 500 MG 24 hr tablet Pt takes 500 mg 1tab in the morning and 1000 mg in the evening     metoprolol (LOPRESSOR) 100 MG tablet Take 1 tablet (100 mg total) by mouth 2 (two) times daily.     ONETOUCH VERIO test strip SMARTSIG:1 Strip(s) Via Meter 1-2 Times Daily     sildenafil (VIAGRA) 50 MG tablet Take 50 mg by mouth as needed.     XARELTO 20 MG TABS tablet Take 20 mg by mouth daily.      No current facility-administered medications for this visit.    Allergies:   Altace [ramipril], Cozaar [losartan potassium], Rosuvastatin, Elemental sulfur, Penicillins, and Sulfa antibiotics   Social History:  The patient  reports that he has quit smoking. He has never used smokeless tobacco. He reports current alcohol use of about 3.0 standard drinks of alcohol per week. He reports that he does not use drugs.   Family History:  The patient's family history includes Heart disease in his father; Heart failure in his father.  ROS:  Please see the history of present illness.    All other systems are reviewed and otherwise negative.   PHYSICAL EXAM:  VS:  BP Marland Kitchen)  142/92   Pulse (!) 52   Ht 6' (1.829 m)   Wt 248 lb (112.5 kg)   SpO2 98%   BMI 33.63 kg/m  BMI: Body mass index is 33.63 kg/m. Well nourished, well developed, in no acute distress HEENT: normocephalic, atraumatic Neck: no JVD, carotid bruits or masses Cardiac:  RRR; no significant murmurs, no rubs, or gallops Lungs:  CTA b/l, no wheezing, rhonchi or rales Abd: soft, nontender MS: no deformity or atrophy Ext: no edema Skin: warm and dry, no rash Neuro:  No gross deficits appreciated Psych: euthymic mood, full affect  EKG:  Done today and reviewed by myself shows  SB, rate 53bpm. PR interval 180ms, stable from previous. QRS duration  WNL.   09/03/21: stress myoview  The study is normal. The study is low risk.   No ST deviation was noted.   Left ventricular function is normal. Nuclear stress EF: 53 %. The left ventricular ejection fraction is mildly decreased (45-54%).   Prior study not available for comparison.   Normal, stress nuclear study with inferior thinning but no ischemia.  Gated ejection fraction low normal at 53% with normal wall motion.   09/13/21; TTE 1. Left ventricular ejection fraction, by estimation, is 55 to 60%. The  left ventricle has normal function. The left ventricle has no regional  wall motion abnormalities. Left ventricular diastolic parameters are  indeterminate.   2. Right ventricular systolic function is low normal. The right  ventricular size is mildly enlarged. There is mildly elevated pulmonary  artery systolic pressure. The estimated right ventricular systolic  pressure is 36.7 mmHg.   3. Left atrial size was mildly dilated.   4. Right atrial size was mildly dilated.   5. The mitral valve is normal in structure. Trivial mitral valve  regurgitation. No evidence of mitral stenosis.   6. The aortic valve is grossly normal. Aortic valve regurgitation is not  visualized. No aortic stenosis is present.   Comparison(s): No significant change from prior study.   Conclusion(s)/Recommendation(s): Otherwise normal echocardiogram, with  minor abnormalities described in the report.   Recent Labs: No results found for requested labs within last 365 days.  No results found for requested labs within last 365 days.   CrCl cannot be calculated (Patient's most recent lab result is older than the maximum 21 days allowed.).   Wt Readings from Last 3 Encounters:  12/05/22 248 lb (112.5 kg)  08/16/22 246 lb (111.6 kg)  06/26/22 242 lb 6.4 oz (110 kg)     Other studies reviewed: Additional studies/records reviewed today include: summarized above  ASSESSMENT AND PLAN:  #) Paroxysmal Afib #)  Hypercoagulable d/t AF Taking flecainide and metop with decreasing effectiveness EKG stable on flecainide We discussed the risks/benefits of procedure and the patient desires to proceed. Pre-op labs today, pre-op CT scheduled  CHA2DS2-VASc Score = 4 [CHF History: 1, HTN History: 1, Diabetes History: 1, Stroke History: 0, Vascular Disease History: 0, Age Score: 1, Gender Score: 0].  Therefore, the patient's annual risk of stroke is 4.8 %.    AC - xarelto 20mg daily, dose appropriate.    #) HTN Slightly elevated today in clinic, remained elevated on re-check Checks BP somewhat regularly at home, has chart on phone. Most readings are 120-140/70-80s He has had dietary indiscretions lately (increased fast food), reduced exercise level Shared decision making about whether to increase ARB. Patient prefers to try non-pharmacologic at this time.     Disposition: F/u with AF ablation as   scheduled, usual post-op appts afterwards  Current medicines are reviewed at length with the patient today.  The patient did not have any concerns regarding medicines.  Signed, Ruthell Feigenbaum, NP 12/05/22 12:49 PM  [facility] [address] [place] [address] [phone] (office)  [phone] (fax)   

## 2022-12-03 NOTE — Progress Notes (Addendum)
Cardiology Office Note Date:  12/05/2022  Patient ID:  Gerald Baker 1951-07-01, MRN [id number] PCP:  Emilio Aspen, MD  Cardiologist:  Dr. Katrinka Blazing Electrophysiologist: Dr. Elberta Fortis    Chief Complaint: pre-op AF ablation   History of Present Illness: Gerald Baker is a 71 y.o. male with history of HTN, HLD, DM, TIA, AFib  He saw Dr. Katrinka Blazing 11/19/21, had lost weight, had an up-tick in AF burden requiring a change from pill in the pocket strategy to daily flecainide.  He saw Dr. Elberta Fortis 08/16/22, discussed strategies for AFib management and planned to pursue an ablation, Scheduled 01/02/23.  He continues to have intermittent palpitation episodes, couple times in last 2-3 months but nothing in last 6 weeks. Episodes becoming longer and more often. No CP, pressure with episodes. Weight stable, though admits to gaining about 10lbs the past year after losing 25. No Edema. No SOB or DOE.   Had a sleep study that did not show OSA. He is anxious about upcoming procedure, has talked to friends, and does wish to proceed.  He is very diligent about xarelto, no concerns with bleeding.   AFib/AAD hx Diagnosed 2012  Flecainide started 2017 with PRN use >> scheduled med in Aug 2022   Past Medical History:  Diagnosis Date   Diabetes mellitus without complication (HCC)    Hypertension    Persistent atrial fibrillation (HCC)    chads2vasc is 2.  on xarelto   Stroke [facility])     Past Surgical History:  Procedure Laterality Date   CARDIOVERSION N/A 10/19/2015   Procedure: CARDIOVERSION;  Surgeon: Chilton Si, MD;  Location: [facility] END[facility];  Service: Cardiovascular;  Laterality: N/A;   KNEE ARTHROSCOPY      Current Outpatient Medications  Medication Sig Dispense Refill   Ciclopirox 1 % shampoo Apply topically as needed.     diltiazem (CARDIZEM) 30 MG tablet Take 1 tablet every 4 hours AS NEEDED for AFIB heart rate >100 as long as top blood pressure >100. 45 tablet 1    flecainide (TAMBOCOR) 50 MG tablet Take 1 tablet (50 mg total) by mouth 2 (two) times daily. 180 tablet 1   irbesartan (AVAPRO) 150 MG tablet Take 150 mg by mouth daily.     metFORMIN (GLUCOPHAGE-XR) 500 MG 24 hr tablet Pt takes 500 mg 1tab in the morning and 1000 mg in the evening     metoprolol (LOPRESSOR) 100 MG tablet Take 1 tablet (100 mg total) by mouth 2 (two) times daily.     ONETOUCH VERIO test strip SMARTSIG:1 Strip(s) Via Meter 1-2 Times Daily     sildenafil (VIAGRA) 50 MG tablet Take 50 mg by mouth as needed.     XARELTO 20 MG TABS tablet Take 20 mg by mouth daily.      No current facility-administered medications for this visit.    Allergies:   Altace [ramipril], Cozaar [losartan potassium], Rosuvastatin, Elemental sulfur, Penicillins, and Sulfa antibiotics   Social History:  The patient  reports that he has quit smoking. He has never used smokeless tobacco. He reports current alcohol use of about 3.0 standard drinks of alcohol per week. He reports that he does not use drugs.   Family History:  The patient's family history includes Heart disease in his father; Heart failure in his father.  ROS:  Please see the history of present illness.    All other systems are reviewed and otherwise negative.   PHYSICAL EXAM:  VS:  BP Marland Kitchen)  142/92   Pulse (!) 52   Ht 6' (1.829 m)   Wt 248 lb (112.5 kg)   SpO2 98%   BMI 33.63 kg/m  BMI: Body mass index is 33.63 kg/m. Well nourished, well developed, in no acute distress HEENT: normocephalic, atraumatic Neck: no JVD, carotid bruits or masses Cardiac:  RRR; no significant murmurs, no rubs, or gallops Lungs:  CTA b/l, no wheezing, rhonchi or rales Abd: soft, nontender MS: no deformity or atrophy Ext: no edema Skin: warm and dry, no rash Neuro:  No gross deficits appreciated Psych: euthymic mood, full affect  EKG:  Done today and reviewed by myself shows  SB, rate 53bpm. PR interval , stable from previous. QRS duration  WNL.   09/03/21: stress myoview  The study is normal. The study is low risk.   No ST deviation was noted.   Left ventricular function is normal. Nuclear stress EF: 53 %. The left ventricular ejection fraction is mildly decreased (45-54%).   Prior study not available for comparison.   Normal, stress nuclear study with inferior thinning but no ischemia.  Gated ejection fraction low normal at 53% with normal wall motion.   09/13/21; TTE 1. Left ventricular ejection fraction, by estimation, is 55 to 60%. The  left ventricle has normal function. The left ventricle has no regional  wall motion abnormalities. Left ventricular diastolic parameters are  indeterminate.   2. Right ventricular systolic function is low normal. The right  ventricular size is mildly enlarged. There is mildly elevated pulmonary  artery systolic pressure. The estimated right ventricular systolic  pressure is 36.7 mmHg.   3. Left atrial size was mildly dilated.   4. Right atrial size was mildly dilated.   5. The mitral valve is normal in structure. Trivial mitral valve  regurgitation. No evidence of mitral stenosis.   6. The aortic valve is grossly normal. Aortic valve regurgitation is not  visualized. No aortic stenosis is present.   Comparison(s): No significant change from prior study.   Conclusion(s)/Recommendation(s): Otherwise normal echocardiogram, with  minor abnormalities described in the report.   Recent Labs: No results found for requested labs within last 365 days.  No results found for requested labs within last 365 days.   CrCl cannot be calculated (Patient's most recent lab result is older than the maximum 21 days allowed.).   Wt Readings from Last 3 Encounters:  12/05/22 248 lb (112.5 kg)  08/16/22 246 lb (111.6 kg)  06/26/22 242 lb 6.4 oz (110 kg)     Other studies reviewed: Additional studies/records reviewed today include: summarized above  ASSESSMENT AND PLAN:  #) Paroxysmal Afib #)  Hypercoagulable d/t AF Taking flecainide and metop with decreasing effectiveness EKG stable on flecainide We discussed the risks/benefits of procedure and the patient desires to proceed. Pre-op labs today, pre-op CT scheduled  CHA2DS2-VASc Score = 4 [CHF History: 1, HTN History: 1, Diabetes History: 1, Stroke History: 0, Vascular Disease History: 0, Age Score: 1, Gender Score: 0].  Therefore, the patient's annual risk of stroke is 4.8 %.    AC - xarelto 20mg  daily, dose appropriate.    #) HTN Slightly elevated today in clinic, remained elevated on re-check Checks BP somewhat regularly at home, has chart on phone. Most readings are 120-140/70-80s He has had dietary indiscretions lately (increased fast food), reduced exercise level Shared decision making about whether to increase ARB. Patient prefers to try non-pharmacologic at this time.     Disposition: F/u with AF ablation as  scheduled, usual post-op appts afterwards  Current medicines are reviewed at length with the patient today.  The patient did not have any concerns regarding medicines.  SignedSherie Don, NP 12/05/22 12:49 PM  [facility] [address] [place] [address] [phone] (office)  [phone] (fax)

## 2022-12-05 ENCOUNTER — Ambulatory Visit: Payer: Medicare Other | Attending: Physician Assistant | Admitting: Cardiology

## 2022-12-05 ENCOUNTER — Encounter: Payer: Self-pay | Admitting: Physician Assistant

## 2022-12-05 ENCOUNTER — Other Ambulatory Visit: Payer: Medicare Other

## 2022-12-05 VITALS — BP 142/92 | HR 52 | Ht 72.0 in | Wt 248.0 lb

## 2022-12-05 DIAGNOSIS — Z01812 Encounter for preprocedural laboratory examination: Secondary | ICD-10-CM

## 2022-12-05 DIAGNOSIS — I4891 Unspecified atrial fibrillation: Secondary | ICD-10-CM | POA: Insufficient documentation

## 2022-12-05 DIAGNOSIS — I5032 Chronic diastolic (congestive) heart failure: Secondary | ICD-10-CM | POA: Diagnosis not present

## 2022-12-05 DIAGNOSIS — I10 Essential (primary) hypertension: Secondary | ICD-10-CM | POA: Diagnosis not present

## 2022-12-05 DIAGNOSIS — D6869 Other thrombophilia: Secondary | ICD-10-CM | POA: Insufficient documentation

## 2022-12-05 DIAGNOSIS — I4819 Other persistent atrial fibrillation: Secondary | ICD-10-CM

## 2022-12-05 DIAGNOSIS — I480 Paroxysmal atrial fibrillation: Secondary | ICD-10-CM

## 2022-12-05 LAB — BASIC METABOLIC PANEL
BUN/Creatinine Ratio: 18 (ref 10–24)
BUN: 16 mg/dL (ref 8–27)
CO2: 25 mmol/L (ref 20–29)
Calcium: 9.3 mg/dL (ref 8.6–10.2)
Chloride: 99 mmol/L (ref 96–106)
Creatinine, Ser: 0.87 mg/dL (ref 0.76–1.27)
Glucose: 76 mg/dL (ref 70–99)
Potassium: 5.4 mmol/L — ABNORMAL HIGH (ref 3.5–5.2)
Sodium: 137 mmol/L (ref 134–144)
eGFR: 93 mL/min/{1.73_m2} (ref >59)

## 2022-12-05 LAB — CBC
Hematocrit: 43.3 % (ref 37.5–51.0)
Hemoglobin: 14.6 g/dL (ref 13.0–17.7)
MCH: 30.1 pg (ref 26.6–33.0)
MCHC: 33.7 g/dL (ref 31.5–35.7)
MCV: 89 fL (ref 79–97)
Platelets: 284 10*3/uL (ref 150–450)
RBC: 4.85 x10E6/uL (ref 4.14–5.80)
RDW: 13.4 % (ref 11.6–15.4)
WBC: 7.7 10*3/uL (ref 3.4–10.8)

## 2022-12-05 NOTE — Patient Instructions (Signed)
Medication Instructions:   Your physician recommends that you continue on your current medications as directed. Please refer to the Current Medication list given to you today.  *If you need a refill on your cardiac medications before your next appointment, please call your pharmacy*   Lab Work:  BMET AND CBC TODAY   If you have labs (blood work) drawn today and your tests are completely normal, you will receive your results only by: MyChart Message (if you have MyChart) OR A paper copy in the mail If you have any lab test that is abnormal or we need to change your treatment, we will call you to review the results.   Testing/Procedures: SEE LETTER FOR ABLATION Your physician has recommended that you have an ablation. Catheter ablation is a medical procedure used to treat some cardiac arrhythmias (irregular heartbeats). During catheter ablation, a long, thin, flexible tube is put into a blood vessel in your groin (upper thigh), or neck. This tube is called an ablation catheter. It is then guided to your heart through the blood vessel. Radio frequency waves destroy small areas of heart tissue where abnormal heartbeats may cause an arrhythmia to start. Please see the instruction sheet given to you today.     Follow-Up: At [facility], you and your health needs are our priority.  As part of our continuing mission to provide you with exceptional heart care, we have created designated Provider Care Teams.  These Care Teams include your primary Cardiologist (physician) and Advanced Practice Providers (APPs -  Physician Assistants and Nurse Practitioners) who all work together to provide you with the care you need, when you need it.  We recommend signing up for the patient portal called "MyChart".  Sign up information is provided on this After Visit Summary.  MyChart is used to connect with patients for Virtual Visits (Telemedicine).  Patients are able to view lab/test results, encounter  notes, upcoming appointments, etc.  Non-urgent messages can be sent to your provider as well.   To learn more about what you can do with MyChart, go to [email or web address].    Your next appointment:  AFTER PROCEDURE YOU WILL BE NOTIFY  OF FOLLOW UP APPOINTMENT   The format for your next appointment:   In Person  Provider:    Atrial Fibrillation Clinic located at [facility]. Your provider will be: Rudi Coco, NP or Clint R. Fenton, PA-C    Other Instructions   Important Information About Sugar

## 2022-12-10 ENCOUNTER — Encounter: Payer: Self-pay | Admitting: Interventional Cardiology

## 2022-12-26 ENCOUNTER — Encounter (HOSPITAL_BASED_OUTPATIENT_CLINIC_OR_DEPARTMENT_OTHER): Payer: Self-pay

## 2022-12-26 ENCOUNTER — Ambulatory Visit (HOSPITAL_BASED_OUTPATIENT_CLINIC_OR_DEPARTMENT_OTHER)
Admission: RE | Admit: 2022-12-26 | Discharge: 2022-12-26 | Disposition: A | Discharge status: Home / Self Care | Payer: Medicare Other | Source: Ambulatory Visit | Attending: Cardiology | Admitting: Cardiology

## 2022-12-26 DIAGNOSIS — I4819 Other persistent atrial fibrillation: Secondary | ICD-10-CM | POA: Insufficient documentation

## 2022-12-26 MED ORDER — IOHEXOL 350 MG/ML SOLN
100.0000 mL | Freq: Once | INTRAVENOUS | Status: AC | PRN
  Administered 2022-12-26: 80 mL via INTRAVENOUS

## 2022-12-30 ENCOUNTER — Other Ambulatory Visit (HOSPITAL_COMMUNITY): Payer: Self-pay | Admitting: *Deleted

## 2022-12-30 MED ORDER — FLECAINIDE ACETATE 50 MG PO TABS: 50.0000 mg | ORAL_TABLET | Freq: Two times a day (BID) | ORAL | 1 refills | Status: DC

## 2022-12-31 ENCOUNTER — Telehealth: Payer: Self-pay | Admitting: Cardiology

## 2022-12-31 NOTE — Telephone Encounter (Signed)
I spoke to pt about 2pm, advised him to contact his insurance to ensure approval. But also assured pt that our billing dept sends prior auth request if required, and that no news is good news. Pt reports he already has his estimate from the hospital. He appreciates my return call.

## 2022-12-31 NOTE — Telephone Encounter (Signed)
Patient is calling wanting to confirm there is authorization for his 01/11 procedure.

## 2022-12-31 NOTE — Telephone Encounter (Signed)
Aware that we send a message to billing dept for prior auth if it is needed. Advised to call his insurance to verify approval if needed. Patient verbalized understanding and agreeable to plan.

## 2022-12-31 NOTE — Telephone Encounter (Signed)
[employer] calling about PA for Ablation if its been completed or not.

## 2023-01-01 NOTE — Pre-Procedure Instructions (Signed)
Instructed patient on the following items: Arrival time 0800 Nothing to eat or drink after midnight No meds AM of procedure Responsible person to drive you home and stay with you for 24 hrs  Have you missed any doses of anti-coagulant Xarelto- hasn't missed any doses    

## 2023-01-02 ENCOUNTER — Other Ambulatory Visit: Payer: Self-pay

## 2023-01-02 ENCOUNTER — Other Ambulatory Visit (HOSPITAL_COMMUNITY): Payer: Self-pay | Admitting: *Deleted

## 2023-01-02 ENCOUNTER — Ambulatory Visit (HOSPITAL_COMMUNITY): Payer: Medicare Other | Admitting: Certified Registered"

## 2023-01-02 ENCOUNTER — Encounter (HOSPITAL_COMMUNITY)
Admission: RE | Disposition: A | Discharge status: Home / Self Care | Payer: Self-pay | Source: Home / Self Care | Attending: Cardiology

## 2023-01-02 ENCOUNTER — Ambulatory Visit (HOSPITAL_BASED_OUTPATIENT_CLINIC_OR_DEPARTMENT_OTHER): Payer: Medicare Other | Admitting: Certified Registered"

## 2023-01-02 ENCOUNTER — Ambulatory Visit (HOSPITAL_COMMUNITY)
Admission: RE | Admit: 2023-01-02 | Discharge: 2023-01-02 | Disposition: A | Discharge status: Home / Self Care | Payer: Medicare Other | Source: Home / Self Care | Attending: Cardiology | Admitting: Cardiology

## 2023-01-02 ENCOUNTER — Emergency Department (HOSPITAL_COMMUNITY): Payer: Medicare Other

## 2023-01-02 ENCOUNTER — Encounter (HOSPITAL_COMMUNITY): Payer: Self-pay

## 2023-01-02 ENCOUNTER — Observation Stay (HOSPITAL_COMMUNITY)
Admission: EM | Admit: 2023-01-02 | Discharge: 2023-01-03 | Disposition: A | Discharge status: Home / Self Care | Payer: Medicare Other | Attending: Internal Medicine | Admitting: Internal Medicine

## 2023-01-02 ENCOUNTER — Telehealth: Payer: Self-pay | Admitting: Cardiology

## 2023-01-02 DIAGNOSIS — E119 Type 2 diabetes mellitus without complications: Secondary | ICD-10-CM | POA: Insufficient documentation

## 2023-01-02 DIAGNOSIS — Z8673 Personal history of transient ischemic attack (TIA), and cerebral infarction without residual deficits: Secondary | ICD-10-CM | POA: Insufficient documentation

## 2023-01-02 DIAGNOSIS — G8918 Other acute postprocedural pain: Secondary | ICD-10-CM | POA: Insufficient documentation

## 2023-01-02 DIAGNOSIS — I480 Paroxysmal atrial fibrillation: Secondary | ICD-10-CM | POA: Insufficient documentation

## 2023-01-02 DIAGNOSIS — I4891 Unspecified atrial fibrillation: Secondary | ICD-10-CM | POA: Diagnosis not present

## 2023-01-02 DIAGNOSIS — Z7901 Long term (current) use of anticoagulants: Secondary | ICD-10-CM | POA: Diagnosis not present

## 2023-01-02 DIAGNOSIS — D649 Anemia, unspecified: Secondary | ICD-10-CM | POA: Insufficient documentation

## 2023-01-02 DIAGNOSIS — E785 Hyperlipidemia, unspecified: Secondary | ICD-10-CM | POA: Insufficient documentation

## 2023-01-02 DIAGNOSIS — I10 Essential (primary) hypertension: Secondary | ICD-10-CM | POA: Diagnosis not present

## 2023-01-02 DIAGNOSIS — Z87891 Personal history of nicotine dependence: Secondary | ICD-10-CM | POA: Insufficient documentation

## 2023-01-02 DIAGNOSIS — S7011XA Contusion of right thigh, initial encounter: Secondary | ICD-10-CM

## 2023-01-02 DIAGNOSIS — L7632 Postprocedural hematoma of skin and subcutaneous tissue following other procedure: Principal | ICD-10-CM | POA: Insufficient documentation

## 2023-01-02 DIAGNOSIS — M1990 Unspecified osteoarthritis, unspecified site: Secondary | ICD-10-CM | POA: Insufficient documentation

## 2023-01-02 DIAGNOSIS — S301XXA Contusion of abdominal wall, initial encounter: Secondary | ICD-10-CM | POA: Diagnosis present

## 2023-01-02 DIAGNOSIS — R52 Pain, unspecified: Secondary | ICD-10-CM | POA: Diagnosis not present

## 2023-01-02 DIAGNOSIS — Z8249 Family history of ischemic heart disease and other diseases of the circulatory system: Secondary | ICD-10-CM | POA: Insufficient documentation

## 2023-01-02 DIAGNOSIS — Z79899 Other long term (current) drug therapy: Secondary | ICD-10-CM | POA: Insufficient documentation

## 2023-01-02 DIAGNOSIS — Z7984 Long term (current) use of oral hypoglycemic drugs: Secondary | ICD-10-CM

## 2023-01-02 DIAGNOSIS — R55 Syncope and collapse: Secondary | ICD-10-CM

## 2023-01-02 DIAGNOSIS — R609 Edema, unspecified: Secondary | ICD-10-CM | POA: Diagnosis not present

## 2023-01-02 HISTORY — PX: ATRIAL FIBRILLATION ABLATION: EP1191

## 2023-01-02 LAB — CBC WITH DIFFERENTIAL/PLATELET
Abs Immature Granulocytes: 0.03 10*3/uL (ref 0.00–0.07)
Basophils Absolute: 0 10*3/uL (ref 0.0–0.1)
Basophils Relative: 0 %
Eosinophils Absolute: 0 10*3/uL (ref 0.0–0.5)
Eosinophils Relative: 0 %
HCT: 37.9 % — ABNORMAL LOW (ref 39.0–52.0)
Hemoglobin: 12.4 g/dL — ABNORMAL LOW (ref 13.0–17.0)
Immature Granulocytes: 0 %
Lymphocytes Relative: 15 %
Lymphs Abs: 1.8 10*3/uL (ref 0.7–4.0)
MCH: 30.4 pg (ref 26.0–34.0)
MCHC: 32.7 g/dL (ref 30.0–36.0)
MCV: 92.9 fL (ref 80.0–100.0)
Monocytes Absolute: 0.7 10*3/uL (ref 0.1–1.0)
Monocytes Relative: 6 %
Neutro Abs: 9.1 10*3/uL — ABNORMAL HIGH (ref 1.7–7.7)
Neutrophils Relative %: 79 %
Platelets: 272 10*3/uL (ref 150–400)
RBC: 4.08 MIL/uL — ABNORMAL LOW (ref 4.22–5.81)
RDW: 13.2 % (ref 11.5–15.5)
WBC: 11.7 10*3/uL — ABNORMAL HIGH (ref 4.0–10.5)
nRBC: 0 % (ref 0.0–0.2)

## 2023-01-02 LAB — BASIC METABOLIC PANEL
Anion gap: 7 (ref 5–15)
BUN: 15 mg/dL (ref 8–23)
CO2: 26 mmol/L (ref 22–32)
Calcium: 8.1 mg/dL — ABNORMAL LOW (ref 8.9–10.3)
Chloride: 101 mmol/L (ref 98–111)
Creatinine, Ser: 0.98 mg/dL (ref 0.61–1.24)
GFR, Estimated: >60 mL/min (ref >60)
Glucose, Bld: 196 mg/dL — ABNORMAL HIGH (ref 70–99)
Potassium: 4.3 mmol/L (ref 3.5–5.1)
Sodium: 134 mmol/L — ABNORMAL LOW (ref 135–145)

## 2023-01-02 LAB — GLUCOSE, CAPILLARY
Glucose-Capillary: 128 mg/dL — ABNORMAL HIGH (ref 70–99)
Glucose-Capillary: 98 mg/dL (ref 70–99)
Glucose-Capillary: 137 mg/dL — ABNORMAL HIGH (ref 70–99)

## 2023-01-02 LAB — POCT ACTIVATED CLOTTING TIME
Activated Clotting Time: 304 seconds
Activated Clotting Time: 314 seconds

## 2023-01-02 SURGERY — ATRIAL FIBRILLATION ABLATION
Anesthesia: General

## 2023-01-02 MED ORDER — HEPARIN (PORCINE) IN NACL 1000-0.9 UT/500ML-% IV SOLN
INTRAVENOUS | Status: AC
  Filled 2023-01-02: qty 500

## 2023-01-02 MED ORDER — PROTAMINE SULFATE 10 MG/ML IV SOLN
INTRAVENOUS | Status: DC | PRN
  Administered 2023-01-02: 40 mg via INTRAVENOUS

## 2023-01-02 MED ORDER — SUGAMMADEX SODIUM 200 MG/2ML IV SOLN
INTRAVENOUS | Status: DC | PRN
  Administered 2023-01-02: 225 mg via INTRAVENOUS

## 2023-01-02 MED ORDER — HEPARIN (PORCINE) IN NACL 1000-0.9 UT/500ML-% IV SOLN
INTRAVENOUS | Status: DC | PRN
  Administered 2023-01-02 (×4): 500 mL

## 2023-01-02 MED ORDER — HEPARIN SODIUM (PORCINE) 1000 UNIT/ML IJ SOLN
INTRAMUSCULAR | Status: DC | PRN
  Administered 2023-01-02: 1000 [IU] via INTRAVENOUS

## 2023-01-02 MED ORDER — SODIUM CHLORIDE 0.9 % IV SOLN: INTRAVENOUS | Status: DC

## 2023-01-02 MED ORDER — PROPOFOL 10 MG/ML IV BOLUS
INTRAVENOUS | Status: DC | PRN
  Administered 2023-01-02: 50 mg via INTRAVENOUS
  Administered 2023-01-02: 30 mg via INTRAVENOUS
  Administered 2023-01-02: 20 mg via INTRAVENOUS
  Administered 2023-01-02: 150 mg via INTRAVENOUS

## 2023-01-02 MED ORDER — FLECAINIDE ACETATE 50 MG PO TABS: 50.0000 mg | ORAL_TABLET | Freq: Two times a day (BID) | ORAL | 1 refills | Status: DC

## 2023-01-02 MED ORDER — ONDANSETRON HCL 4 MG/2ML IJ SOLN
INTRAMUSCULAR | Status: DC | PRN
  Administered 2023-01-02: 4 mg via INTRAVENOUS

## 2023-01-02 MED ORDER — PHENYLEPHRINE 80 MCG/ML (10ML) SYRINGE FOR IV PUSH (FOR BLOOD PRESSURE SUPPORT)
PREFILLED_SYRINGE | INTRAVENOUS | Status: DC | PRN
  Administered 2023-01-02: 80 ug via INTRAVENOUS

## 2023-01-02 MED ORDER — SODIUM CHLORIDE 0.9 % IV BOLUS
1000.0000 mL | Freq: Once | INTRAVENOUS | Status: AC
  Administered 2023-01-02: 1000 mL via INTRAVENOUS

## 2023-01-02 MED ORDER — ACETAMINOPHEN 325 MG PO TABS: 650.0000 mg | ORAL_TABLET | ORAL | Status: DC | PRN

## 2023-01-02 MED ORDER — DOBUTAMINE INFUSION FOR EP/ECHO/NUC (1000 MCG/ML)
INTRAVENOUS | Status: DC | PRN
  Administered 2023-01-02: 20 ug/kg/min via INTRAVENOUS

## 2023-01-02 MED ORDER — SODIUM CHLORIDE 0.9 % IV SOLN: 250.0000 mL | INTRAVENOUS | Status: DC | PRN

## 2023-01-02 MED ORDER — SODIUM CHLORIDE 0.9% FLUSH: 3.0000 mL | INTRAVENOUS | Status: DC | PRN

## 2023-01-02 MED ORDER — FENTANYL CITRATE (PF) 250 MCG/5ML IJ SOLN
INTRAMUSCULAR | Status: DC | PRN
  Administered 2023-01-02: 100 ug via INTRAVENOUS

## 2023-01-02 MED ORDER — DOBUTAMINE INFUSION FOR EP/ECHO/NUC (1000 MCG/ML)
INTRAVENOUS | Status: AC
  Filled 2023-01-02: qty 250

## 2023-01-02 MED ORDER — HEPARIN SODIUM (PORCINE) 1000 UNIT/ML IJ SOLN
INTRAMUSCULAR | Status: DC | PRN
  Administered 2023-01-02: 3000 [IU] via INTRAVENOUS
  Administered 2023-01-02: 2000 [IU] via INTRAVENOUS
  Administered 2023-01-02: 15000 [IU] via INTRAVENOUS

## 2023-01-02 MED ORDER — ONDANSETRON HCL 4 MG/2ML IJ SOLN: 4.0000 mg | Freq: Four times a day (QID) | INTRAMUSCULAR | Status: DC | PRN

## 2023-01-02 MED ORDER — IOHEXOL 350 MG/ML SOLN
100.0000 mL | Freq: Once | INTRAVENOUS | Status: AC | PRN
  Administered 2023-01-02: 100 mL via INTRAVENOUS

## 2023-01-02 MED ORDER — ROCURONIUM BROMIDE 10 MG/ML (PF) SYRINGE
PREFILLED_SYRINGE | INTRAVENOUS | Status: DC | PRN
  Administered 2023-01-02: 30 mg via INTRAVENOUS
  Administered 2023-01-02: 60 mg via INTRAVENOUS

## 2023-01-02 MED ORDER — SODIUM CHLORIDE 0.9% FLUSH: 3.0000 mL | Freq: Two times a day (BID) | INTRAVENOUS | Status: DC

## 2023-01-02 MED ORDER — PHENYLEPHRINE HCL-NACL 20-0.9 MG/250ML-% IV SOLN
INTRAVENOUS | Status: DC | PRN
  Administered 2023-01-02: 50 ug/min via INTRAVENOUS

## 2023-01-02 MED ORDER — HEPARIN SODIUM (PORCINE) 1000 UNIT/ML IJ SOLN
INTRAMUSCULAR | Status: AC
  Filled 2023-01-02: qty 10

## 2023-01-02 SURGICAL SUPPLY — 20 items
BAG SNAP BAND KOVER 36X36 (MISCELLANEOUS) IMPLANT
CATH 8FR REPROCESSED SOUNDSTAR (CATHETERS) ×1 IMPLANT
CATH 8FR SOUNDSTAR REPROCESSED (CATHETERS) IMPLANT
CATH ABLAT QDOT MICRO BI TC DF (CATHETERS) IMPLANT
CATH OCTARAY 1.5 F (CATHETERS) IMPLANT
CATH PIGTAIL STEERABLE D1 8.7 (WIRE) IMPLANT
CATH S-M CIRCA TEMP PROBE (CATHETERS) IMPLANT
CATH WEB BI DIR CSDF CRV REPRO (CATHETERS) IMPLANT
CLOSURE PERCLOSE PROSTYLE (VASCULAR PRODUCTS) IMPLANT
COVER SWIFTLINK CONNECTOR (BAG) ×1 IMPLANT
PACK EP LATEX FREE (CUSTOM PROCEDURE TRAY) ×1
PACK EP LF (CUSTOM PROCEDURE TRAY) ×1 IMPLANT
PAD DEFIB RADIO PHYSIO CONN (PAD) ×1 IMPLANT
PATCH CARTO3 (PAD) IMPLANT
SHEATH CARTO VIZIGO SM CVD (SHEATH) IMPLANT
SHEATH PINNACLE 7F 10CM (SHEATH) IMPLANT
SHEATH PINNACLE 8F 10CM (SHEATH) IMPLANT
SHEATH PINNACLE 9F 10CM (SHEATH) IMPLANT
SHEATH PROBE COVER 6X72 (BAG) IMPLANT
TUBING SMART ABLATE COOLFLOW (TUBING) IMPLANT

## 2023-01-02 NOTE — ED Notes (Signed)
Patient became diaphoretic and reported feeling like he was going to pass out. MD notified. MD came to bedside gave verbal order to administer 1 L NS bolus.

## 2023-01-02 NOTE — Discharge Instructions (Signed)
Post procedure care instructions No driving for 4 days. No lifting over 5 lbs for 1 week. No vigorous or sexual activity for 1 week. You may return to work/your usual activities on 01/10/23. Keep procedure site clean & dry. If you notice increased pain, swelling, bleeding or pus, call/return!  You may shower after 24 hours, but no soaking in baths/hot tubs/pools for 1 week.    You have an appointment set up with the [facility]ibrillation [facility].  Multiple studies have shown that being followed by a dedicated atrial fibrillation clinic in addition to the standard care you receive from your other physicians improves health. We believe that enrollment in the atrial fibrillation clinic will allow us to better care for you.   The phone number to the At[facility]ibrillation Clinic is [phone]. The clinic is staffed Monday through Friday from 8:30am to 5pm.  Directions: The clinic is located in the [facility], 6TH FLOOR Enter the hospital at the MAIN ENTRANCE "A", use North Tower Elevators to the 6th floor.  Registration desk to the right of elevators on 6th floor  If you have any trouble locating the clinic, please don't hesitate to call [phone].  

## 2023-01-02 NOTE — Anesthesia Preprocedure Evaluation (Signed)
Anesthesia Evaluation  Patient identified by MRN, date of birth, ID band Patient awake    Reviewed: Allergy & Precautions, NPO status , Patient's Chart, lab work & pertinent test results  Airway Mallampati: III  TM Distance: >3 FB Neck ROM: Full    Dental no notable dental hx.    Pulmonary former smoker   Pulmonary exam normal        Cardiovascular hypertension, Pt. on medications and Pt. on home beta blockers Normal cardiovascular exam+ dysrhythmias Atrial Fibrillation      Neuro/Psych TIA negative psych ROS   GI/Hepatic negative GI ROS, Neg liver ROS,,,  Endo/Other  diabetes, Oral Hypoglycemic Agents    Renal/GU negative Renal ROS     Musculoskeletal  (+) Arthritis ,    Abdominal   Peds  Hematology  (+) Blood dyscrasia (Xarelto)   Anesthesia Other Findings A-fib  Reproductive/Obstetrics                              Anesthesia Physical Anesthesia Plan  ASA: 3  Anesthesia Plan: General   Post-op Pain Management:    Induction: Intravenous  PONV Risk Score and Plan: 2 and Ondansetron, Dexamethasone, Midazolam and Treatment may vary due to age or medical condition  Airway Management Planned: Oral ETT  Additional Equipment:   Intra-op Plan:   Post-operative Plan: Extubation in OR  Informed Consent: I have reviewed the patients History and Physical, chart, labs and discussed the procedure including the risks, benefits and alternatives for the proposed anesthesia with the patient or authorized representative who has indicated his/her understanding and acceptance.     Dental advisory given  Plan Discussed with: CRNA  Anesthesia Plan Comments:          Anesthesia Quick Evaluation

## 2023-01-02 NOTE — Interval H&P Note (Signed)
History and Physical Interval Note:  01/02/2023 9:08 AM  Gerald Baker  has presented today for surgery, with the diagnosis of afib.  The various methods of treatment have been discussed with the patient and family. After consideration of risks, benefits and other options for treatment, the patient has consented to  Procedure(s): ATRIAL FIBRILLATION ABLATION (N/A) as a surgical intervention.  The patient's history has been reviewed, patient examined, no change in status, stable for surgery.  I have reviewed the patient's chart and labs.  Questions were answered to the patient's satisfaction.     Leanndra Pember [employer]

## 2023-01-02 NOTE — Anesthesia Postprocedure Evaluation (Signed)
Anesthesia Post Note  Patient: Gerald Baker  Procedure(s) Performed: ATRIAL FIBRILLATION ABLATION     Patient location during evaluation: PACU Anesthesia Type: General Level of consciousness: awake Pain management: pain level controlled Vital Signs Assessment: post-procedure vital signs reviewed and stable Respiratory status: spontaneous breathing, nonlabored ventilation and respiratory function stable Cardiovascular status: blood pressure returned to baseline and stable Postop Assessment: no apparent nausea or vomiting Anesthetic complications: no   No notable events documented.  Last Vitals:  Vitals:   01/02/23 1432 01/02/23 1500  BP: (!) 163/72 (!) 151/89  Pulse: (!) 59 62  Resp: 14 16  Temp:    SpO2: 99% 99%    Last Pain:  Vitals:   01/02/23 1249  TempSrc:   PainSc: 0-No pain                 Kelten Enochs P Carriann Hesse

## 2023-01-02 NOTE — ED Provider Notes (Signed)
[facility] EMERGENCY DEPARTMENT Provider Note   CSN: [id number] Arrival date & time: 01/02/23  2003     History  Chief Complaint  Patient presents with   Post-op Problem    Patient BIB [facility] from home with complaint of hematoma right inner thigh post cardiac ablation today.      Gerald Baker is a 72 y.o. male presenting from home with concern for postoperative hematoma.  The patient underwent A-fib ablation today with bilateral venous femoral sheaths introduced per the operative report by Dr. Elberta Fortis.  The patient reports he was observed postoperatively at about 4 PM and was doing fine and sent home.  He did take his normal evening Xarelto that he takes every day, because he was instructed to continue Xarelto medicine by his cardiologist.  He reports that at some point in the early evening he noticed that he was having swelling in his right groin.  He now has significant firmness and swelling around the right inguinal region.  He was told to come back to the ED.  HPI     Home Medications Prior to Admission medications   Medication Sig Start Date End Date Taking? Authorizing Provider  Ciclopirox 1 % shampoo Apply 1 Application topically as needed (during the summer). 04/29/22   [provider]  diltiazem (CARDIZEM) 30 MG tablet Take 1 tablet every 4 hours AS NEEDED for AFIB heart rate >100 as long as top blood pressure >100. 08/17/21   Newman Nip, NP  flecainide (TAMBOCOR) 50 MG tablet Take 1 tablet (50 mg total) by mouth 2 (two) times daily. 01/02/23   Newman Nip, NP  irbesartan (AVAPRO) 150 MG tablet Take 150 mg by mouth daily. 05/01/18   [provider]  metFORMIN (GLUCOPHAGE-XR) 500 MG 24 hr tablet Pt takes 500 mg 1tab in the morning and 1000 mg in the evening 04/25/17   [provider]  metoprolol (LOPRESSOR) 100 MG tablet Take 1 tablet (100 mg total) by mouth 2 (two) times daily. 11/25/14   Lyn Records, MD  Letta Pate VERIO test  strip SMARTSIG:1 Strip(s) Via Meter 1-2 Times Daily 10/26/21   [provider]  sildenafil (VIAGRA) 50 MG tablet Take 50 mg by mouth as needed for erectile dysfunction. 02/08/20   [provider]  XARELTO 20 MG TABS tablet Take 20 mg by mouth daily.  12/10/17   [provider]      Allergies    Altace [ramipril], Cozaar [losartan potassium], Rosuvastatin, Elemental sulfur, Penicillins, and Sulfa antibiotics    Review of Systems   Review of Systems  Physical Exam Updated Vital Signs BP 93/60   Pulse (!) 56   Temp 98.7 F (37.1 C) (Oral)   Resp 16   Ht 6' (1.829 m)   Wt 111.1 kg   SpO2 99%   BMI 33.22 kg/m  Physical Exam Constitutional:      General: He is not in acute distress.    Appearance: He is obese.  HENT:     Head: Normocephalic and atraumatic.  Eyes:     Conjunctiva/sclera: Conjunctivae normal.     Pupils: Pupils are equal, round, and reactive to light.  Cardiovascular:     Rate and Rhythm: Normal rate and regular rhythm.  Pulmonary:     Effort: Pulmonary effort is normal. No respiratory distress.  Abdominal:     General: There is no distension.     Tenderness: There is no abdominal tenderness.  Musculoskeletal:  Comments: Firmness with mild tenderness involving the right inguinal region, consistent with hematoma  Skin:    General: Skin is warm and dry.  Neurological:     General: No focal deficit present.     Mental Status: He is alert. Mental status is at baseline.  Psychiatric:        Mood and Affect: Mood normal.        Behavior: Behavior normal.     ED Results / Procedures / Treatments   Labs (all labs ordered are listed, but only abnormal results are displayed) Labs Reviewed  BASIC METABOLIC PANEL - Abnormal; Notable for the following components:      Result Value   Sodium 134 (*)    Glucose, Bld 196 (*)    Calcium 8.1 (*)    All other components within normal limits  CBC WITH DIFFERENTIAL/PLATELET     EKG None  Radiology EP STUDY  Result Date: 01/02/2023 SURGEON:  Allegra Lai, MD PREPROCEDURE DIAGNOSES: 1. Paroxysmal atrial fibrillation. POSTPROCEDURE DIAGNOSES: 1. Paroxysmal  atrial fibrillation. PROCEDURES: 1. Comprehensive electrophysiologic study. 2. Coronary sinus pacing and recording. 3. Three-dimensional mapping of atrial fibrillation with additional mapping and ablation of a second discrete focus 4. Ablation of atrial fibrillation with additional mapping and ablation of a second discrete focus 5. Intracardiac echocardiography. 6. Transseptal puncture of an intact septum. 7. Arrhythmia induction with pacing with dobutamine infusion INTRODUCTION:  Gerald Baker is a 72 y.o. male with a history of paroxysmal atrial fibrillation who now presents for EP study and radiofrequency ablation.  The patient reports initially being diagnosed with atrial fibrillation after presenting with symptomatic palpitations and fatgiue. The patient reports increasing frequency and duration of atrial fibrillation since that time.  The patient has failed medical therapy.  The patient therefore presents today for catheter ablation of atrial fibrillation. DESCRIPTION OF PROCEDURE:  Informed written consent was obtained, and the patient was brought to the electrophysiology lab in a fasting state.  The patient was adequately sedated with intravenous medications as outlined in the anesthesia report.  The patient's left and right groins were prepped and draped in the usual sterile fashion by the EP lab staff.  Using a percutaneous Seldinger technique, two 8-French hemostasis sheaths were placed in the right femoral vein, and one 7 [place] and one 11-French hemostasis sheaths were placed into the left common femoral vein. An esophageal temperature probe was inserted to monitor for heating of the esophagus during the procedure.  Each sheath site was preclosed using an Abbott Perclose and closed at the end of the case. Direct  ultrasound guidance is used for right and left femoral veins with normal vessel patency. Ultrasound images are captured and stored in the patient's chart. Using ultrasound guidance, the Brockenbrough needle and wire were visualized entering the vessel. Catheter Placement:  A 7-French Biosense Webster Decapolar coronary sinus catheter was introduced through the right common femoral vein and advanced into the coronary sinus for recording and pacing from this location.  A luminal esophageal temperature probe was placed and used for continuous monitoring of the luminal esophageal temperature throughout the procedure as well as to localize the esophagus on fluoroscopy. In addition, the esophagus was directly visualized with intracardiac echo and its positioned marked on Carto.  During ablation at the posterior wall there was limited esophageal heating noted during RF energy delivery with the maximal temperature recorded by the luminal temperature probe of < 38.5 degrees C. Initial Measurements: The patient presented to the electrophysiology lab in  sinus rhythm. his  PR interval measured 154 msec with a QRS duration of 79 msec and a QT interval of 446 msec.     Intracardiac Echocardiography: An 8-French Biosense Webster AcuNav intracardiac echocardiography catheter was introduced through the right common femoral vein and advanced into the right atrium. Intracardiac echocardiography was performed of the left atrium, and a three-dimensional anatomical rendering of the left atrium was performed using CARTO sound technology.  The patient was noted to have a moderate sized left atrium.  The interatrial septum was prominent but not aneurysmal. All 4 pulmonary veins were visualized and noted to have separate ostia.  The pulmonary veins were moderate in size.  The left atrial appendage was visualized and did not reveal thrombus.   There was no evidence of pulmonary vein stenosis. Transseptal Puncture: The right common femoral  vein sheaths were exchanged for one 8.5 [employer] and one Bayless sheath and transseptal access was achieved with the Bayless in a standard fashion using a Bayless needle under fluoroscopy with intracardiac echocardiography confirmation of the transseptal puncture.  Once transseptal access had been achieved, heparin was administered intravenously and intra- arterially in order to maintain an ACT of greater than 350 seconds throughout the procedure. 3D Mapping and Ablation: A 3.5 mm Biosense [employer] Thermocool ablation catheter was advanced into the right atrium through the Visigo sheath.  The Bayless sheath was pulled back into the IVC over a guidewire.  The ablation catheter was advanced across the transseptal hole using the wire as a guide.  The transseptal sheath was then re-advanced over the guidewire into the left atrium.  A Biosense [employer] mapping catheter was introduced through the transseptal sheath and positioned over the mouth of all 4 pulmonary veins.  Three-dimensional electroanatomical mapping was performed using CARTO technology.  This demonstrated electrical activity within all four pulmonary veins at baseline. The patient underwent successful sequential electrical isolation and anatomical encircling of all four pulmonary veins using radiofrequency current with a circular mapping catheter as a guide.  A WACA approach was used.  Entrance and exit block were confirmed. Cardioversion: The patient was then cardioverted to sinus rhythm with a single synchronized 360-J biphasic shock with cardioversion electrodes in the anterior-posterior thoracic configuration. He maintained sinus rhythm initially but then had recurence of afib with catheter manipulation within the left atrial, requiring repeat cardioversion with 360J biphasic.  He remained in sinus rhythm thereafter. Measurements Following Ablation: Following ablation, dobutamine was infused up to 20 mcg/kg/min  with no inducible atrial fibrillation, atrial tachycardia, atrial flutter, or sustained PACs. In sinus rhythm with RR interval was 880 msec, with PR 167 msec, QRS 88 msec, and QT 431 msec.  Following ablation the AH interval measured 94 msec with an HV interval of 55 msec. Ventricular pacing was performed, which revealed midline decremental VA conduction with a VA Wenckebach cycle length of less than 300 msec. Rapid atrial pacing was performed, which revealed an AV Wenckebach cycle length of 320 msec.  Electroisolation was then again confirmed in all four pulmonary veins.  Pacing was performed along the ablation line which confirmed entrance and exit block. The procedure was therefore considered completed.  All catheters were removed, and the sheaths were aspirated and flushed.  The patient was transferred to the recovery area for sheath removal per protocol. EBL<21ml.  Intracardiac echocardiogram revealed no pericardial effusion.  There were no early apparent complications. CONCLUSIONS: 1. Sinus rhythm upon presentation.  2. Successful electrical isolation and anatomical  encircling of all four pulmonary veins with radiofrequency current. 3. No inducible arrhythmias following ablation both on and off of dobutamine 4. No early apparent complications. Will Hassell Done Camnitz,MD 11:37 AM 01/02/2023    Procedures Procedures    Medications Ordered in ED Medications  sodium chloride 0.9 % bolus 1,000 mL (1,000 mLs Intravenous New Bag/Given 01/02/23 2130)    ED Course/ Medical Decision Making/ A&P Clinical Course as of 01/02/23 2343  Thu Jan 02, 2023  2134 I was called into the room as the patient had an episode of diaphoresis and her blood pressure dropped.  He was feeling lightheaded.  He was placed in Trendelenburg and fluids were started.  Subsequently began to feel much better and his blood pressure has normalized back to 376 systolic.  I suspect this was likely a vasovagal episode.  We will continue to  monitor him. [MT]  2151 Repaged cardiology fellow [MT]  2151 BP remains stable and patient remains asymptomatic [MT]  2235 BP stable 283 systolic- pt mentating well [MT]  2306 Pt taken for CT.  I consulted with Dr Grayce Sessions from cardiology who will come see patient, agrees with CTA, and applying pressure to wound site [MT]    Clinical Course User Index [MT] Amory Zbikowski, Carola Rhine, MD                           Medical Decision Making Amount and/or Complexity of Data Reviewed Labs: ordered. Radiology: ordered.  Risk Prescription drug management.   Patient is here with swelling around the insertion site of the right femoral venous sheath from earlier today, he is on anticoagulation with Xarelto.  Clinically there is some concern about a fairly moderate-sized hematoma around the access site on the right side.  The left side appears unaffected.  I discussed the case with cardiology to see what type of workup or imaging they would prefer.  The patient is otherwise hemodynamically stable.  He is in minimal discomfort, overall comfortable appearing.  His wife is also present at the bedside.  Telemetry was reviewed during the patient stay and he remained in sinus rhythm, regular heart rate.  External records were reviewed including his operative report today from the EP doctor, noting introduction of venous femoral sheath bilaterally with 8 french biosense webster acunav catheter  1130 pm - patient signed out to Dr Leonette Monarch EDP pending f/u on CT imaging and cardiology consultation.        Final Clinical Impression(s) / ED Diagnoses Final diagnoses:  Post-operative pain  Hematoma of right thigh, initial encounter    Rx / DC Orders ED Discharge Orders     None         Orlandria Kissner, Carola Rhine, MD 01/02/23 [phone]

## 2023-01-02 NOTE — Telephone Encounter (Signed)
Pt had a fib ablation today and did well initially but now hematoma on groin, size of a softball.  They have called EMS and the pt is holding pressure.  Will see what EMS feels but may need to return for further management to hospital.

## 2023-01-02 NOTE — ED Notes (Signed)
Placed co band dressing to right thigh per verbal order by MD Trifan.

## 2023-01-02 NOTE — Anesthesia Procedure Notes (Signed)
Procedure Name: Intubation Date/Time: 01/02/2023 9:51 AM  Performed by: Cathren Harsh, CRNAPre-anesthesia Checklist: Patient identified, Emergency Drugs available, Suction available and Patient being monitored Patient Re-evaluated:Patient Re-evaluated prior to induction Oxygen Delivery Method: Circle System Utilized Preoxygenation: Pre-oxygenation with 100% oxygen Induction Type: IV induction Ventilation: Mask ventilation without difficulty Laryngoscope Size: Mac, 4 and Glidescope Grade View: Grade II Tube type: Oral Tube size: 7.5 mm Number of attempts: 1 Airway Equipment and Method: Stylet and Oral airway Placement Confirmation: ETT inserted through vocal cords under direct vision, positive ETCO2 and breath sounds checked- equal and bilateral Secured at: 23 cm Tube secured with: Tape Dental Injury: Teeth and Oropharynx as per pre-operative assessment  Difficulty Due To: Difficult Airway- due to large tongue and Difficulty was anticipated Comments: Initial DL with MAC 4 yielded grade 3 view despite troubleshooting and positioning changes.

## 2023-01-02 NOTE — Transfer of Care (Signed)
Immediate Anesthesia Transfer of Care Note  Patient: Gerald Baker  Procedure(s) Performed: ATRIAL FIBRILLATION ABLATION  Patient Location: PACU  Anesthesia Type:General  Level of Consciousness: awake, drowsy, patient cooperative, and responds to stimulation  Airway & Oxygen Therapy: Patient Spontanous Breathing and Patient connected to nasal cannula oxygen  Post-op Assessment: Report given to RN and Post -op Vital signs reviewed and stable  Post vital signs: Reviewed and stable  Last Vitals:  Vitals Value Taken Time  BP    Temp    Pulse 62 01/02/23 1150  Resp    SpO2 99 % 01/02/23 1150  Vitals shown include unvalidated device data.  Last Pain:  Vitals:   01/02/23 0843  TempSrc: Temporal         Complications: No notable events documented.

## 2023-01-03 ENCOUNTER — Encounter (HOSPITAL_COMMUNITY): Payer: Self-pay | Admitting: Cardiology

## 2023-01-03 ENCOUNTER — Observation Stay (HOSPITAL_BASED_OUTPATIENT_CLINIC_OR_DEPARTMENT_OTHER): Payer: Medicare Other

## 2023-01-03 DIAGNOSIS — I480 Paroxysmal atrial fibrillation: Secondary | ICD-10-CM

## 2023-01-03 DIAGNOSIS — S301XXA Contusion of abdominal wall, initial encounter: Secondary | ICD-10-CM

## 2023-01-03 DIAGNOSIS — I10 Essential (primary) hypertension: Secondary | ICD-10-CM

## 2023-01-03 DIAGNOSIS — S7011XA Contusion of right thigh, initial encounter: Secondary | ICD-10-CM

## 2023-01-03 LAB — TYPE AND SCREEN
ABO/RH(D): O POS
Antibody Screen: NEGATIVE

## 2023-01-03 LAB — HIV ANTIBODY (ROUTINE TESTING W REFLEX): HIV Screen 4th Generation wRfx: NONREACTIVE

## 2023-01-03 LAB — CBC
HCT: 31.7 % — ABNORMAL LOW (ref 39.0–52.0)
HCT: 30.2 % — ABNORMAL LOW (ref 39.0–52.0)
Hemoglobin: 10.3 g/dL — ABNORMAL LOW (ref 13.0–17.0)
Hemoglobin: 10.1 g/dL — ABNORMAL LOW (ref 13.0–17.0)
MCH: 30.7 pg (ref 26.0–34.0)
MCH: 30.5 pg (ref 26.0–34.0)
MCHC: 32.5 g/dL (ref 30.0–36.0)
MCHC: 33.4 g/dL (ref 30.0–36.0)
MCV: 94.6 fL (ref 80.0–100.0)
MCV: 91.2 fL (ref 80.0–100.0)
Platelets: 209 10*3/uL (ref 150–400)
Platelets: 219 10*3/uL (ref 150–400)
RBC: 3.35 MIL/uL — ABNORMAL LOW (ref 4.22–5.81)
RBC: 3.31 MIL/uL — ABNORMAL LOW (ref 4.22–5.81)
RDW: 13.5 % (ref 11.5–15.5)
RDW: 13.3 % (ref 11.5–15.5)
WBC: 10.1 10*3/uL (ref 4.0–10.5)
WBC: 10.4 10*3/uL (ref 4.0–10.5)
nRBC: 0 % (ref 0.0–0.2)
nRBC: 0 % (ref 0.0–0.2)

## 2023-01-03 LAB — ABO/RH: ABO/RH(D): O POS

## 2023-01-03 MED ORDER — SODIUM CHLORIDE 0.9 % IV BOLUS
1000.0000 mL | Freq: Once | INTRAVENOUS | Status: AC
  Administered 2023-01-03: 1000 mL via INTRAVENOUS

## 2023-01-03 MED ORDER — FLECAINIDE ACETATE 50 MG PO TABS
50.0000 mg | ORAL_TABLET | Freq: Two times a day (BID) | ORAL | Status: DC
  Administered 2023-01-03: 50 mg via ORAL
  Filled 2023-01-03: qty 1

## 2023-01-03 MED ORDER — FENTANYL CITRATE PF 50 MCG/ML IJ SOSY
75.0000 ug | PREFILLED_SYRINGE | Freq: Once | INTRAMUSCULAR | Status: AC
  Administered 2023-01-03: 75 ug via INTRAVENOUS
  Filled 2023-01-03: qty 2

## 2023-01-03 MED ORDER — METOPROLOL TARTRATE 25 MG PO TABS
25.0000 mg | ORAL_TABLET | Freq: Two times a day (BID) | ORAL | Status: DC
  Administered 2023-01-03: 25 mg via ORAL
  Filled 2023-01-03: qty 1

## 2023-01-03 MED ORDER — ONDANSETRON HCL 4 MG/2ML IJ SOLN: 4.0000 mg | Freq: Four times a day (QID) | INTRAMUSCULAR | Status: DC | PRN

## 2023-01-03 MED ORDER — ACETAMINOPHEN 325 MG PO TABS: 650.0000 mg | ORAL_TABLET | ORAL | Status: DC | PRN

## 2023-01-03 NOTE — Discharge Summary (Addendum)
ELECTROPHYSIOLOGY PROCEDURE DISCHARGE SUMMARY    Patient ID: Gerald Baker,  MRN: [id number], DOB/AGE: [birth year] 70 y.o.  Admit date: 01/02/2023 Discharge date: 01/03/2023  Primary Care Physician: Kathalene Frames, MD Primary Cardiologist: Dr. Tamala Julian Electrophysiologist: Dr. Curt Bears  Primary Discharge Diagnosis:  R groin hematoma  Secondary Discharge Diagnosis:  Paroxysmal Afib CHA2DS2Vasc is 5, on Xarelto HTN DM Stroke history   Allergies  Allergen Reactions   Altace [Ramipril] Cough   Cozaar [Losartan Potassium] Other (See Comments)    Leg pain   Fentanyl Other (See Comments)    BP dropped, passed out    Rosuvastatin Other (See Comments)    Causes legs to ache   Elemental Sulfur Rash    Might have meant (Sulfa??)   Penicillins Hives and Rash    Has patient had a PCN reaction causing immediate rash, facial/tongue/throat swelling, SOB or lightheadedness with hypotension: Yes Has patient had a PCN reaction causing severe rash involving mucus membranes or skin necrosis: No Has patient had a PCN reaction that required hospitalization No Has patient had a PCN reaction occurring within the last 10 years: No If all of the above answers are "NO", then may proceed with Cephalosporin use.    Sulfa Antibiotics Hives and Rash    Allergy from childhood     Procedures This Admission:  none  Brief HPI: Gerald Baker is a 72 y.o. male   discharged post ablation yesterday as usual with stable procedure sites. ABout an hour after getting home, and after taking his xarelto as instructed, he noted increasing pain, swelling to his R groin site and came in.   CT confirmed large hematoma, 13.2 x 4.6 x 11.8cm. No arterial extravasation . Admitted for observation, serial CBCs, his home [place]:  The had pressure held for about 2o minutes, patient's CBC was  monitored, Vitals remained stable R groin [place] was done finding no pseudoaneurysm or AV  fistula Hgb stabilized at 10.1 Recheck on the groin with no enlarging of hematoma, and subjectively continues to feel better by the patient's report. He was examined by Dr. Quentin Ore and considered stable for discharge to home  Early follow up with the Afib clinic is in place for Wed next week. His BP is normal, a little lower then his usual, Rohini Jaroszewski have him hold his Avapro until his BP at home is > 130  To resume his Xarelto Sunday evening   Physical Exam: Vitals:   01/03/23 0945 01/03/23 1030 01/03/23 1245 01/03/23 1330  BP: 118/67 138/72 (!) 140/76 123/74  Pulse: 68 64 65 65  Resp: 13 17 15 15   Temp:      TempSrc:      SpO2: 96% 100% 100% 99%  Weight:      Height:           Labs:   Lab Results  Component Value Date   WBC 10.4 01/03/2023   HGB 10.1 (L) 01/03/2023   HCT 30.2 (L) 01/03/2023   MCV 91.2 01/03/2023   PLT 219 01/03/2023    Recent Labs  Lab 01/02/23 2125  NA 134*  K 4.3  CL 101  CO2 26  BUN 15  CREATININE 0.98  CALCIUM 8.1*  GLUCOSE 196*    Discharge Medications:  Allergies as of 01/03/2023       Reactions   Altace [ramipril] Cough   Cozaar [losartan Potassium] Other (See Comments)   Leg pain   Fentanyl Other (See Comments)  BP dropped, passed out    Rosuvastatin Other (See Comments)   Causes legs to ache   Elemental Sulfur Rash   Might have meant (Sulfa??)   Penicillins Hives, Rash   Has patient had a PCN reaction causing immediate rash, facial/tongue/throat swelling, SOB or lightheadedness with hypotension: Yes Has patient had a PCN reaction causing severe rash involving mucus membranes or skin necrosis: No Has patient had a PCN reaction that required hospitalization No Has patient had a PCN reaction occurring within the last 10 years: No If all of the above answers are "NO", then may proceed with Cephalosporin use.   Sulfa Antibiotics Hives, Rash   Allergy from childhood        Medication List     TAKE these medications     acetaminophen 500 MG tablet Commonly known as: TYLENOL Take 500 mg by mouth as needed for headache.   Ciclopirox 1 % shampoo Apply 1 Application topically as needed (during the summer).   diltiazem 30 MG tablet Commonly known as: Cardizem Take 1 tablet every 4 hours AS NEEDED for AFIB heart rate >100 as long as top blood pressure >100. What changed:  how much to take how to take this when to take this reasons to take this additional instructions   flecainide 50 MG tablet Commonly known as: TAMBOCOR Take 1 tablet (50 mg total) by mouth 2 (two) times daily.   fluticasone 50 MCG/ACT nasal spray Commonly known as: FLONASE Place 2 sprays into both nostrils as needed for allergies.   irbesartan 150 MG tablet Commonly known as: AVAPRO Take 150 mg by mouth daily. Notes to patient: Do not resume until your blood pressure has gotten higher then 130 at home   metFORMIN 500 MG 24 hr tablet Commonly known as: GLUCOPHAGE-XR Take 500-1,000 mg by mouth See admin instructions. Take 500 mg by mouth in the morning and 100 mg by mouth at night   metoprolol tartrate 100 MG tablet Commonly known as: LOPRESSOR Take 1 tablet (100 mg total) by mouth 2 (two) times daily.   TUMS PO Take 3 tablets by mouth as needed (heartburn).   Xarelto 20 MG Tabs tablet Generic drug: rivaroxaban Take 20 mg by mouth daily.        Disposition: Home Discharge Instructions     Diet - low sodium heart healthy   Complete by: As directed    Increase activity slowly   Complete by: As directed    Increase activity slowly   Complete by: As directed         Duration of Discharge Encounter: Greater than 30 minutes including physician time.  Venetia Night, PA-C 01/03/2023 2:19 PM   I have seen and examined this patient with Tommye Standard.  Agree with above, note added to reflect my findings.  Patient admitted for right grin hematoma post AF ablation. Had swelling and tightness but no pain. CT  and ultrasound without active bleeding. Willadean Guyton plan for discharge. Hold xarelto for 2 days.  GEN: Well nourished, well developed, in no acute distress  HEENT: normal  Neck: no JVD, carotid bruits, or masses Cardiac: RRR; no murmurs, rubs, or gallops,no edema  Respiratory:  clear to auscultation bilaterally, normal work of breathing GI: soft, nontender, nondistended, + BS MS: no deformity or atrophy  Skin: warm and dry, right groin hematoma Neuro:  Strength and sensation are intact Psych: euthymic mood, full affect     Kimika Streater M. Duvall Comes MD 01/03/2023 3:30 PM

## 2023-01-03 NOTE — ED Notes (Signed)
Cardiology at bedside

## 2023-01-03 NOTE — Consult Note (Addendum)
Cardiology Consultation   Patient ID: Gerald Baker MRN: [id number]; DOB: 1951-11-23  Admit date: 01/02/2023 Date of Consult: 01/03/2023  PCP:  Emilio Aspen, MD   [facility] Providers Cardiologist:  Lesleigh Noe, MD    Patient Profile:   Gerald Baker is a 72 y.o. male with a hx of HTN, mild LVH, DM, stroke, afib who is being seen 01/03/2023 for the evaluation of post ablation groin hematoma at the request of Dr. Andres Ege.  History of Present Illness:   Gerald Baker was discharged post ablation yesterday as usual with stable procedure sites. ABout an hour after getting home, and after taking his xarelto as instructed, he noted increasing pain, swelling to his R groin site and came in.  CT confirmed large hematoma, 13.2 x 4.6 x 11.8cm. No arterial extravasation . Admitted for observation, serial CBCs, his home xarelto held VSS  LABS K+ 4.3 BUN/Creat 15/0.98 WBC 11.7 > 10.1 Plts 272, 209  12/05/22, pre-op Hgb 14.6 > 12.4 > 10.3  12/05/22 pre-oop Hct 43.3 > 37.9 > 31.7  Pt feels like his groin is better, certainly not worse, not as tight/burning sensation he had is resolved. No CP, SOB He did not do any unusual activities yesterday, really once in the house stayed on the sofa. Wife reports he followed the directions exactly.  They held pressure last evening about by his report, he did get fentanyl with that, BP got low with it.   Past Medical History:  Diagnosis Date   Diabetes mellitus without complication (HCC)    Hypertension    Persistent atrial fibrillation (HCC)    chads2vasc is 2.  on xarelto   Stroke [facility])     Past Surgical History:  Procedure Laterality Date   ATRIAL FIBRILLATION ABLATION N/A 01/02/2023   Procedure: ATRIAL FIBRILLATION ABLATION;  Surgeon: Regan Lemming, MD;  Location: [facility] INVASIVE CV LAB;  Service: Cardiovascular;  Laterality: N/A;   CARDIOVERSION N/A 10/19/2015   Procedure: CARDIOVERSION;  Surgeon:  Chilton Si, MD;  Location: [facility] ENDOSCOPY;  Service: Cardiovascular;  Laterality: N/A;   KNEE ARTHROSCOPY       Home Medications:  Prior to Admission medications   Medication Sig Start Date End Date Taking? Authorizing Provider  flecainide (TAMBOCOR) 50 MG tablet Take 1 tablet (50 mg total) by mouth 2 (two) times daily. 01/02/23  Yes Newman Nip, NP  irbesartan (AVAPRO) 150 MG tablet Take 150 mg by mouth daily. 05/01/18  Yes [provider]  metFORMIN (GLUCOPHAGE-XR) 500 MG 24 hr tablet Pt takes 500 mg 1tab in the morning and 1000 mg in the evening 04/25/17  Yes [provider]  metoprolol (LOPRESSOR) 100 MG tablet Take 1 tablet (100 mg total) by mouth 2 (two) times daily. 11/25/14  Yes Lyn Records, MD  sildenafil (VIAGRA) 50 MG tablet Take 50 mg by mouth as needed for erectile dysfunction. 02/08/20  Yes [provider]  XARELTO 20 MG TABS tablet Take 20 mg by mouth daily.  12/10/17  Yes [provider]  Ciclopirox 1 % shampoo Apply 1 Application topically as needed (during the summer). 04/29/22   [provider]  diltiazem (CARDIZEM) 30 MG tablet Take 1 tablet every 4 hours AS NEEDED for AFIB heart rate >100 as long as top blood pressure >100. 08/17/21   Newman Nip, NP  Letta Pate VERIO test strip SMARTSIG:1 Strip(s) Via Meter 1-2 Times Daily 10/26/21   [provider]  Inpatient Medications: Scheduled Meds:  flecainide  50 mg Oral BID   Continuous Infusions:  PRN Meds: acetaminophen, ondansetron (ZOFRAN) IV  Allergies:    Allergies  Allergen Reactions   Altace [Ramipril] Cough   Cozaar [Losartan Potassium] Other (See Comments)    Leg pain   Rosuvastatin Other (See Comments)    Causes legs to ache   Elemental Sulfur Rash    Might have meant (Sulfa??)   Penicillins Hives and Rash    Has patient had a PCN reaction causing immediate rash, facial/tongue/throat swelling, SOB or lightheadedness with hypotension: Yes Has  patient had a PCN reaction causing severe rash involving mucus membranes or skin necrosis: No Has patient had a PCN reaction that required hospitalization No Has patient had a PCN reaction occurring within the last 10 years: No If all of the above answers are "NO", then may proceed with Cephalosporin use.    Sulfa Antibiotics Rash    Allergy from childhood    Social History:   Social History   Socioeconomic History   Marital status: Married    Spouse name: Not on file   Number of children: Not on file   Years of education: Not on file   Highest education level: Not on file  Occupational History   Not on file  Tobacco Use   Smoking status: Former   Smokeless tobacco: Never   Tobacco comments:    Quit 1997  Vaping Use   Vaping Use: Never used  Substance and Sexual Activity   Alcohol use: Yes    Alcohol/week: 3.0 standard drinks of alcohol    Types: 1 Glasses of wine, 1 Cans of beer, 1 Shots of liquor per week    Comment: daily,limited socially   Drug use: No   Sexual activity: Not on file  Other Topics Concern   Not on file  Social History Narrative   Not on file   Social Determinants of Health   Financial Resource Strain: Not on file  Food Insecurity: Not on file  Transportation Needs: Not on file  Physical Activity: Not on file  Stress: Not on file  Social Connections: Not on file  Intimate Partner Violence: Not on file    Family History:   Family History  Problem Relation Age of Onset   Heart disease Father    Heart failure Father      ROS:  Please see the history of present illness.  All other ROS reviewed and negative.     Physical Exam/Data:   Vitals:   01/03/23 0445 01/03/23 0500 01/03/23 0515 01/03/23 0715  BP: 121/71 118/69 124/76 120/66  Pulse: 62 62 (!) 59 65  Resp: 17 14 19 15   Temp:  98.6 F (37 C)    TempSrc:  Oral    SpO2: 100% 99% 100% 99%  Weight:      Height:        Intake/Output Summary (Last 24 hours) at 01/03/2023 0814 Last  data filed at 01/03/2023 0243 Gross per 24 hour  Intake 2000 ml  Output --  Net 2000 ml      01/02/2023    8:14 PM 01/02/2023    8:43 AM 12/05/2022   11:26 AM  Last 3 Weights  Weight (lbs) 244 lb 14.9 oz 245 lb 248 lb  Weight (kg) 111.1 kg 111.131 kg 112.492 kg     Body mass index is 33.22 kg/m.  General:  Well nourished, well developed, in no acute distress HEENT: normal Neck: no  JVD Vascular: No carotid bruits; Distal pulses 2+ bilaterally Cardiac:  RRR; no murmurs, gallops or rubs Lungs:  CTA b/l, no wheezing, rhonchi or rales  Abd: soft, nontender, no hepatomegaly  Ext: no edema L groin is soft, nontender, no hematoma R goin: soft, minimal if any tenderness elicited with palpation. Mildly firm area of hematoma, not appreciably larger then marked area. Bruising noted Palpable pedal pulses b/l LE  Musculoskeletal:  No deformities,Skin: warm and dry  Neuro:  no focal abnormalities noted Psych:  Normal affect   EKG:  The EKG was personally reviewed and demonstrates:   Post procedure 01/02/23 SB 51bpm, normal intervals  Telemetry:  Telemetry was personally reviewed and demonstrates:   SB/SR 50's mostly -60's  Relevant CV Studies:  01/02/23: EPS/ablation CONCLUSIONS: 1. Sinus rhythm upon presentation.   2. Successful electrical isolation and anatomical encircling of all four pulmonary veins with radiofrequency current. 3. No inducible arrhythmias following ablation both on and off of dobutamine 4. No early apparent complications.  09/13/21: TTE 1. Left ventricular ejection fraction, by estimation, is 55 to 60%. The  left ventricle has normal function. The left ventricle has no regional  wall motion abnormalities. Left ventricular diastolic parameters are  indeterminate.   2. Right ventricular systolic function is low normal. The right  ventricular size is mildly enlarged. There is mildly elevated pulmonary  artery systolic pressure. The estimated right ventricular  systolic  pressure is 22.9 mmHg.   3. Left atrial size was mildly dilated.   4. Right atrial size was mildly dilated.   5. The mitral valve is normal in structure. Trivial mitral valve  regurgitation. No evidence of mitral stenosis.   6. The aortic valve is grossly normal. Aortic valve regurgitation is not  visualized. No aortic stenosis is present.   Comparison(s): No significant change from prior study.   Conclusion(s)/Recommendation(s): Otherwise normal echocardiogram, with  minor abnormalities described in the report.    Laboratory Data:  High Sensitivity Troponin:  No results for input(s): "TROPONINIHS" in the last 720 hours.   Chemistry Recent Labs  Lab 01/02/23 2125  NA 134*  K 4.3  CL 101  CO2 26  GLUCOSE 196*  BUN 15  CREATININE 0.98  CALCIUM 8.1*  GFRNONAA >60  ANIONGAP 7    No results for input(s): "PROT", "ALBUMIN", "AST", "ALT", "ALKPHOS", "BILITOT" in the last 168 hours. Lipids No results for input(s): "CHOL", "TRIG", "HDL", "LABVLDL", "LDLCALC", "CHOLHDL" in the last 168 hours.  Hematology Recent Labs  Lab 01/02/23 2107 01/03/23 0515  WBC 11.7* 10.1  RBC 4.08* 3.35*  HGB 12.4* 10.3*  HCT 37.9* 31.7*  MCV 92.9 94.6  MCH 30.4 30.7  MCHC 32.7 32.5  RDW 13.2 13.5  PLT 272 209   Thyroid No results for input(s): "TSH", "FREET4" in the last 168 hours.  BNPNo results for input(s): "BNP", "PROBNP" in the last 168 hours.  DDimer No results for input(s): "DDIMER" in the last 168 hours.   Radiology/Studies:  CT ANGIO LOWER EXT BILAT W &/OR WO CONTRAST Result Date: 01/02/2023 CLINICAL DATA:  Suspect lower extremity arterial dissection. Patient had femoral venous sheath ablation earlier today with expanding hematoma. Evaluate for hematoma and active bleeding. EXAM: CT ANGIOGRAPHY OF ABDOMINAL AORTA WITH ILIOFEMORAL RUNOFF TECHNIQUE: Multidetector CT imaging of the abdomen, pelvis and lower extremities was performed using the standard protocol during bolus  administration of intravenous contrast. Multiplanar CT image reconstructions and MIPs were obtained to evaluate the vascular anatomy. RADIATION DOSE REDUCTION: This exam was  performed according to the departmental dose-optimization program which includes automated exposure control, adjustment of the mA and/or kV according to patient size and/or use of iterative reconstruction technique. CONTRAST:  OMNIPAQUE IOHEXOL 350 MG/ML SOLN COMPARISON:  None Available. FINDINGS: VASCULAR Abdominal Aorta: The abdominal aorta is normal in caliber with scattered atherosclerotic disease. Celiac: Patent without significant stenosis. SMA: Patent without significant stenosis. IMA: Patent. Right Renal artery: Patent without significant stenosis. Left Renal artery: Patent without significant stenosis. Right lower extremity Common iliac artery: Patent without significant stenosis or aneurysm. Internal iliac artery: Patent. External iliac artery: Patent without significant stenosis. Common femoral artery: Patent without significant stenosis. Profunda femoral artery: Patent without significant stenosis. Superficial femoral artery: Patent without significant stenosis. Popliteal artery: Patent without significant stenosis or aneurysm. Tibioperoneal trunk: Patent without significant stenosis. Runoff vessels: Patent without significant stenosis. Left lower extremity Common iliac artery: Patent without significant stenosis or aneurysm. Internal iliac artery: Patent. External iliac artery: Patent without significant stenosis. Common femoral artery: Patent without significant stenosis. Profunda femoral artery: Patent without significant stenosis. Superficial femoral artery: Patent without significant stenosis. Popliteal artery: Patent without significant stenosis or aneurysm. Tibioperoneal trunk: Patent without significant stenosis. Runoff vessels: Patent without significant stenosis. NON-VASCULAR Inferior chest: There is atelectasis in  the right lower lobe. Hepatobiliary: The liver is normal in size without focal abnormality. No intrahepatic or extrahepatic biliary ductal dilation. The gallbladder appears normal. Spleen: Normal in size without focal abnormality. Pancreas: No pancreatic ductal dilatation or surrounding inflammatory changes. Adrenals/Urinary Tract: Adrenal glands are unremarkable. Kidneys are normal, without renal calculi, focal lesion, or hydronephrosis. Bladder is unremarkable. Stomach/Bowel: The stomach, small bowel and large bowel are normal in caliber without abnormal wall thickening or surrounding inflammatory changes. There are scattered colonic diverticula. The appendix is not seen. Reproductive: Prostate gland is enlarged. Lymphatic: No enlarged lymph nodes in the abdomen or pelvis. Other: No abdominopelvic ascites. No abdominal wall hernia. Musculoskeletal: No aggressive osseous lesions. No acute fracture. Are moderate degenerative changes of the right knee. Right knee joint effusion is present. There is a subcutaneous hematoma in the proximal anterior right thigh with surrounding edema and hyperdense fluid fluid levels. This measures proximally 13.2 x 4.6 x 11.8 cm. There is no active arterial extravasation in this region. IMPRESSION: 1. Large subcutaneous hematoma in the proximal anterior right thigh measuring 13.2 x 4.6 x 11.8 cm. No active arterial extravasation in this region. 2. Right knee joint effusion. No acute fracture. 3. No acute intra-abdominal process. Aortic Atherosclerosis (ICD10-I70.0). Electronically Signed   By: Darliss Cheney M.D.   On: 01/02/2023 23:35     Assessment and Plan:   R groin hematoma H/H is down further, though site appears stable by appearance and patient assessment  Repeat CBC now  Paroxysmal Afib CHA2DS2Vasc is 5 Hold xarelto Continue flecainide with low dose lopressor for now   Risk Assessment/Risk Scores:    For questions or updates, please contact [facility] Please consult [email or web address] for contact info under    Signed, Sheilah Pigeon, PA-C  01/03/2023 8:14 AM  I have seen and examined this patient with Francis Dowse.  Agree with above, note added to reflect my findings.  Presented to the ER with groin hematoma. Felt swelling and pressure in groin when he got home post AF ablation. CT without active arterial bleeding. Anemia with bleeding but stable over the last few hours.  GEN: Well nourished, well developed, in no acute distress  HEENT: normal  Neck: no JVD,  carotid bruits, or masses Cardiac: RRR; no murmurs, rubs, or gallops,no edema  Respiratory:  clear to auscultation bilaterally, normal work of breathing GI: soft, nontender, nondistended, + BS MS: no deformity or atrophy  Skin: warm and dry Neuro:  Strength and sensation are intact Psych: euthymic mood, full affect   Paroxysmal atrial fibrillation: remains in sinus rhythm. Holding xarelto. Groin hematoma: no extravasation on CT. Tyniya Kuyper order ultrasound to better define venous system. If no active bleeding, would be ok to discharge. Sherrica Niehaus address restarting xarelto once ultrasound performed.   Makaela Cando M. Riannon Mukherjee MD 01/03/2023 10:24 AM

## 2023-01-03 NOTE — ED Notes (Signed)
Applied pressure on pt's right femoral artery for 20 minutes. Ended @0108 .

## 2023-01-03 NOTE — Discharge Instructions (Signed)
Minimal ambulation, though no need for strict bed rest, casual paced, short walking only please. No lifting, no exercise until seen at the AFib clinic for further recommendations next week.

## 2023-01-03 NOTE — ED Notes (Signed)
Pt placed on 2L [place] at this time.

## 2023-01-03 NOTE — H&P (Signed)
Cardiology Admission History and Physical:   Patient ID: Gerald Baker MRN: [id number]; DOB: 1951/03/14   Admission date: 01/02/2023  Primary Care Provider: Kathalene Frames, MD [facility] Cardiologist: Sinclair Grooms, MD  [facility] Electrophysiologist:  None   Chief Complaint:  Enlarging R Groin Hematoma  Patient Profile:   Gerald Baker is a 72 y.o. male with PMHx HTN, HLD, T2DM, TIA, pAF, who presents to ED with enlarging R groin hematoma s/p recent AF ablation.   History of Present Illness:   Briefly, Mr. Gerald Baker underwent AF ablation on 01/02/23. The procedure was uncomplicated with access consisting of two 8Fr hemostasis sheaths in RFV and one 7Fr and 11Fr hemostasis sheaths in L CFV.  Successful electrical isolation and anatomical encircling of all four pulmonary veins with no inducible arrhythmias at conclusion of procedure. Patient was able to ambulate prior to discharge and he was discharged home in afternoon on 1/11. Patient was instructed to resume his Xarelto in evening on 1/11. Approximately one hour after arrival home and after taking his Xarelto, he noticed a large hematoma in his R groin. He did not apply pressure at home. The hematoma continued to enlarge "from golf ball to baseball size." He presented to ED for further management of hematoma.   In ED, patient is afebrile, hemodynamically stable in sinus rhythm with HR 50-60s, O2 saturations 100% RA. CTA BiL lower extremities was acquired in ED and showed large subcutaneous hematoma in proximal anterior R thigh measuring 13.2 x 4.6 x 11.8cm. No arterial extravasation. Cardiology consulted for further management.     Past Medical History:  Diagnosis Date   Diabetes mellitus without complication ([place])    Hypertension    Persistent atrial fibrillation ([place])    chads2vasc is 2.  on xarelto   Stroke [facility])     Past Surgical History:  Procedure Laterality Date   CARDIOVERSION N/A 10/19/2015   Procedure:  CARDIOVERSION;  Surgeon: Skeet Latch, MD;  Location: [facility] ENDOSCOPY;  Service: Cardiovascular;  Laterality: N/A;   KNEE ARTHROSCOPY       Medications Prior to Admission: Prior to Admission medications   Medication Sig Start Date End Date Taking? Authorizing Provider  Ciclopirox 1 % shampoo Apply 1 Application topically as needed (during the summer). 04/29/22   [provider]  diltiazem (CARDIZEM) 30 MG tablet Take 1 tablet every 4 hours AS NEEDED for AFIB heart rate >100 as long as top blood pressure >100. 08/17/21   [place] Needs, NP  flecainide (TAMBOCOR) 50 MG tablet Take 1 tablet (50 mg total) by mouth 2 (two) times daily. 01/02/23   [place] Needs, NP  irbesartan (AVAPRO) 150 MG tablet Take 150 mg by mouth daily. 05/01/18   [provider]  metFORMIN (GLUCOPHAGE-XR) 500 MG 24 hr tablet Pt takes 500 mg 1tab in the morning and 1000 mg in the evening 04/25/17   [provider]  metoprolol (LOPRESSOR) 100 MG tablet Take 1 tablet (100 mg total) by mouth 2 (two) times daily. 11/25/14   Belva Crome, MD  Glory Rosebush VERIO test strip SMARTSIG:1 Strip(s) Via Meter 1-2 Times Daily 10/26/21   [provider]  sildenafil (VIAGRA) 50 MG tablet Take 50 mg by mouth as needed for erectile dysfunction. 02/08/20   [provider]  XARELTO 20 MG TABS tablet Take 20 mg by mouth daily.  12/10/17   [provider]     Allergies:    Allergies  Allergen Reactions   Altace [  Ramipril] Cough   Cozaar [Losartan Potassium] Other (See Comments)    Leg pain   Rosuvastatin Other (See Comments)    Causes legs to ache   Elemental Sulfur Rash    Might have meant (Sulfa??)   Penicillins Hives and Rash    Has patient had a PCN reaction causing immediate rash, facial/tongue/throat swelling, SOB or lightheadedness with hypotension: Yes Has patient had a PCN reaction causing severe rash involving mucus membranes or skin necrosis: No Has patient had a PCN reaction that  required hospitalization No Has patient had a PCN reaction occurring within the last 10 years: No If all of the above answers are "NO", then may proceed with Cephalosporin use.    Sulfa Antibiotics Rash    Allergy from childhood    Social History:   Social History   Socioeconomic History   Marital status: Married    Spouse name: Not on file   Number of children: Not on file   Years of education: Not on file   Highest education level: Not on file  Occupational History   Not on file  Tobacco Use   Smoking status: Former   Smokeless tobacco: Never   Tobacco comments:    Quit 1997  Vaping Use   Vaping Use: Never used  Substance and Sexual Activity   Alcohol use: Yes    Alcohol/week: 3.0 standard drinks of alcohol    Types: 1 Glasses of wine, 1 Cans of beer, 1 Shots of liquor per week    Comment: daily,limited socially   Drug use: No   Sexual activity: Not on file  Other Topics Concern   Not on file  Social History Narrative   Not on file   Social Determinants of Health   Financial Resource Strain: Not on file  Food Insecurity: Not on file  Transportation Needs: Not on file  Physical Activity: Not on file  Stress: Not on file  Social Connections: Not on file  Intimate Partner Violence: Not on file    Family History:   The patient's family history includes Heart disease in his father; Heart failure in his father.     Physical Exam/Data:   Vitals:   01/03/23 0145 01/03/23 0200 01/03/23 0215 01/03/23 0230  BP: 113/69 113/72 115/65 118/72  Pulse: (!) 57 (!) 53 (!) 55 (!) 57  Resp: 15 14 15 16   Temp:      TempSrc:      SpO2: 99% 100% 100% 100%  Weight:      Height:        Intake/Output Summary (Last 24 hours) at 01/03/2023 0339 Last data filed at 01/03/2023 0243 Gross per 24 hour  Intake 2000 ml  Output --  Net 2000 ml      01/02/2023    8:14 PM 01/02/2023    8:43 AM 12/05/2022   11:26 AM  Last 3 Weights  Weight (lbs) 244 lb 14.9 oz 245 lb 248 lb   Weight (kg) 111.1 kg 111.131 kg 112.492 kg     Body mass index is 33.22 kg/m.  General:  Well nourished, NAD HEENT: normal Lymph: no adenopathy Neck: no JVD Endocrine:  No thryomegaly Vascular: No carotid bruits; FA pulses 2+ bilaterally without bruits  Cardiac:  normal S1, S2; RRR. Lungs:  clear to auscultation bilaterally, no wheezing, rhonchi or rales  Abd: soft, nontender, no hepatomegaly  Ext: ~10cmx10cm R groin hematoma (outlined on initial assessment). No LEE Musculoskeletal:  No deformities, BUE and BLE strength normal and  equal Skin: warm and dry  Neuro:  CNs 2-12 intact, no focal abnormalities noted Psych:  Normal affect   EKG:  The ECG that was done 01/02/23 was personally reviewed and demonstrates sinus bradycardia, no acute ischemic changes.    Laboratory Data:  High Sensitivity Troponin:  No results for input(s): "TROPONINIHS" in the last 720 hours.    Chemistry Recent Labs  Lab 01/02/23 2125  NA 134*  K 4.3  CL 101  CO2 26  GLUCOSE 196*  BUN 15  CREATININE 0.98  CALCIUM 8.1*  GFRNONAA >60  ANIONGAP 7    No results for input(s): "PROT", "ALBUMIN", "AST", "ALT", "ALKPHOS", "BILITOT" in the last 168 hours. Hematology Recent Labs  Lab 01/02/23 2107  WBC 11.7*  RBC 4.08*  HGB 12.4*  HCT 37.9*  MCV 92.9  MCH 30.4  MCHC 32.7  RDW 13.2  PLT 272   BNPNo results for input(s): "BNP", "PROBNP" in the last 168 hours.  DDimer No results for input(s): "DDIMER" in the last 168 hours.  Radiology/Studies:  CT ANGIO LOWER EXT BILAT W &/OR WO CONTRAST  Result Date: 01/02/2023 CLINICAL DATA:  Suspect lower extremity arterial dissection. Patient had femoral venous sheath ablation earlier today with expanding hematoma. Evaluate for hematoma and active bleeding. EXAM: CT ANGIOGRAPHY OF ABDOMINAL AORTA WITH ILIOFEMORAL RUNOFF TECHNIQUE: Multidetector CT imaging of the abdomen, pelvis and lower extremities was performed using the standard protocol during bolus  administration of intravenous contrast. Multiplanar CT image reconstructions and MIPs were obtained to evaluate the vascular anatomy. RADIATION DOSE REDUCTION: This exam was performed according to the departmental dose-optimization program which includes automated exposure control, adjustment of the mA and/or kV according to patient size and/or use of iterative reconstruction technique. CONTRAST:  OMNIPAQUE IOHEXOL 350 MG/ML SOLN COMPARISON:  None Available. FINDINGS: VASCULAR Abdominal Aorta: The abdominal aorta is normal in caliber with scattered atherosclerotic disease. Celiac: Patent without significant stenosis. SMA: Patent without significant stenosis. IMA: Patent. Right Renal artery: Patent without significant stenosis. Left Renal artery: Patent without significant stenosis. Right lower extremity Common iliac artery: Patent without significant stenosis or aneurysm. Internal iliac artery: Patent. External iliac artery: Patent without significant stenosis. Common femoral artery: Patent without significant stenosis. Profunda femoral artery: Patent without significant stenosis. Superficial femoral artery: Patent without significant stenosis. Popliteal artery: Patent without significant stenosis or aneurysm. Tibioperoneal trunk: Patent without significant stenosis. Runoff vessels: Patent without significant stenosis. Left lower extremity Common iliac artery: Patent without significant stenosis or aneurysm. Internal iliac artery: Patent. External iliac artery: Patent without significant stenosis. Common femoral artery: Patent without significant stenosis. Profunda femoral artery: Patent without significant stenosis. Superficial femoral artery: Patent without significant stenosis. Popliteal artery: Patent without significant stenosis or aneurysm. Tibioperoneal trunk: Patent without significant stenosis. Runoff vessels: Patent without significant stenosis. NON-VASCULAR Inferior chest: There is atelectasis in  the right lower lobe. Hepatobiliary: The liver is normal in size without focal abnormality. No intrahepatic or extrahepatic biliary ductal dilation. The gallbladder appears normal. Spleen: Normal in size without focal abnormality. Pancreas: No pancreatic ductal dilatation or surrounding inflammatory changes. Adrenals/Urinary Tract: Adrenal glands are unremarkable. Kidneys are normal, without renal calculi, focal lesion, or hydronephrosis. Bladder is unremarkable. Stomach/Bowel: The stomach, small bowel and large bowel are normal in caliber without abnormal wall thickening or surrounding inflammatory changes. There are scattered colonic diverticula. The appendix is not seen. Reproductive: Prostate gland is enlarged. Lymphatic: No enlarged lymph nodes in the abdomen or pelvis. Other: No abdominopelvic ascites. No abdominal wall hernia.  Musculoskeletal: No aggressive osseous lesions. No acute fracture. Are moderate degenerative changes of the right knee. Right knee joint effusion is present. There is a subcutaneous hematoma in the proximal anterior right thigh with surrounding edema and hyperdense fluid fluid levels. This measures proximally 13.2 x 4.6 x 11.8 cm. There is no active arterial extravasation in this region. IMPRESSION: 1. Large subcutaneous hematoma in the proximal anterior right thigh measuring 13.2 x 4.6 x 11.8 cm. No active arterial extravasation in this region. 2. Right knee joint effusion. No acute fracture. 3. No acute intra-abdominal process. Aortic Atherosclerosis (ICD10-I70.0). Electronically Signed   By: Darliss Cheney M.D.   On: 01/02/2023 23:35   EP STUDY  Result Date: 01/02/2023 SURGEON:  Loman Brooklyn, MD PREPROCEDURE DIAGNOSES: 1. Paroxysmal atrial fibrillation. POSTPROCEDURE DIAGNOSES: 1. Paroxysmal  atrial fibrillation. PROCEDURES: 1. Comprehensive electrophysiologic study. 2. Coronary sinus pacing and recording. 3. Three-dimensional mapping of atrial fibrillation with additional mapping  and ablation of a second discrete focus 4. Ablation of atrial fibrillation with additional mapping and ablation of a second discrete focus 5. Intracardiac echocardiography. 6. Transseptal puncture of an intact septum. 7. Arrhythmia induction with pacing with dobutamine infusion INTRODUCTION:  DANUEL FELICETTI is a 72 y.o. male with a history of paroxysmal atrial fibrillation who now presents for EP study and radiofrequency ablation.  The patient reports initially being diagnosed with atrial fibrillation after presenting with symptomatic palpitations and fatgiue. The patient reports increasing frequency and duration of atrial fibrillation since that time.  The patient has failed medical therapy.  The patient therefore presents today for catheter ablation of atrial fibrillation. DESCRIPTION OF PROCEDURE:  Informed written consent was obtained, and the patient was brought to the electrophysiology lab in a fasting state.  The patient was adequately sedated with intravenous medications as outlined in the anesthesia report.  The patient's left and right groins were prepped and draped in the usual sterile fashion by the EP lab staff.  Using a percutaneous Seldinger technique, two 8-French hemostasis sheaths were placed in the right femoral vein, and one 7 [place] and one 11-French hemostasis sheaths were placed into the left common femoral vein. An esophageal temperature probe was inserted to monitor for heating of the esophagus during the procedure.  Each sheath site was preclosed using an Abbott Perclose and closed at the end of the case. Direct ultrasound guidance is used for right and left femoral veins with normal vessel patency. Ultrasound images are captured and stored in the patient's chart. Using ultrasound guidance, the Brockenbrough needle and wire were visualized entering the vessel. Catheter Placement:  A 7-French Biosense Webster Decapolar coronary sinus catheter was introduced through the right common femoral  vein and advanced into the coronary sinus for recording and pacing from this location.  A luminal esophageal temperature probe was placed and used for continuous monitoring of the luminal esophageal temperature throughout the procedure as well as to localize the esophagus on fluoroscopy. In addition, the esophagus was directly visualized with intracardiac echo and its positioned marked on Carto.  During ablation at the posterior wall there was limited esophageal heating noted during RF energy delivery with the maximal temperature recorded by the luminal temperature probe of < 38.5 degrees C. Initial Measurements: The patient presented to the electrophysiology lab in sinus rhythm. his  PR interval measured 154 msec with a QRS duration of 79 msec and a QT interval of 446 msec.     Intracardiac Echocardiography: An 8-French Biosense Webster AcuNav intracardiac echocardiography catheter was introduced  through the right common femoral vein and advanced into the right atrium. Intracardiac echocardiography was performed of the left atrium, and a three-dimensional anatomical rendering of the left atrium was performed using CARTO sound technology.  The patient was noted to have a moderate sized left atrium.  The interatrial septum was prominent but not aneurysmal. All 4 pulmonary veins were visualized and noted to have separate ostia.  The pulmonary veins were moderate in size.  The left atrial appendage was visualized and did not reveal thrombus.   There was no evidence of pulmonary vein stenosis. Transseptal Puncture: The right common femoral vein sheaths were exchanged for one 8.5 [employer] and one Bayless sheath and transseptal access was achieved with the Bayless in a standard fashion using a Bayless needle under fluoroscopy with intracardiac echocardiography confirmation of the transseptal puncture.  Once transseptal access had been achieved, heparin was administered intravenously and intra-  arterially in order to maintain an ACT of greater than 350 seconds throughout the procedure. 3D Mapping and Ablation: A 3.5 mm Biosense [employer] Thermocool ablation catheter was advanced into the right atrium through the Visigo sheath.  The Bayless sheath was pulled back into the IVC over a guidewire.  The ablation catheter was advanced across the transseptal hole using the wire as a guide.  The transseptal sheath was then re-advanced over the guidewire into the left atrium.  A Biosense [employer] mapping catheter was introduced through the transseptal sheath and positioned over the mouth of all 4 pulmonary veins.  Three-dimensional electroanatomical mapping was performed using CARTO technology.  This demonstrated electrical activity within all four pulmonary veins at baseline. The patient underwent successful sequential electrical isolation and anatomical encircling of all four pulmonary veins using radiofrequency current with a circular mapping catheter as a guide.  A WACA approach was used.  Entrance and exit block were confirmed. Cardioversion: The patient was then cardioverted to sinus rhythm with a single synchronized 360-J biphasic shock with cardioversion electrodes in the anterior-posterior thoracic configuration. He maintained sinus rhythm initially but then had recurence of afib with catheter manipulation within the left atrial, requiring repeat cardioversion with 360J biphasic.  He remained in sinus rhythm thereafter. Measurements Following Ablation: Following ablation, dobutamine was infused up to 20 mcg/kg/min with no inducible atrial fibrillation, atrial tachycardia, atrial flutter, or sustained PACs. In sinus rhythm with RR interval was 880 msec, with PR 167 msec, QRS 88 msec, and QT 431 msec.  Following ablation the AH interval measured 94 msec with an HV interval of 55 msec. Ventricular pacing was performed, which revealed midline decremental VA conduction with a VA Wenckebach  cycle length of less than 300 msec. Rapid atrial pacing was performed, which revealed an AV Wenckebach cycle length of 320 msec.  Electroisolation was then again confirmed in all four pulmonary veins.  Pacing was performed along the ablation line which confirmed entrance and exit block. The procedure was therefore considered completed.  All catheters were removed, and the sheaths were aspirated and flushed.  The patient was transferred to the recovery area for sheath removal per protocol. EBL<61ml.  Intracardiac echocardiogram revealed no pericardial effusion.  There were no early apparent complications. CONCLUSIONS: 1. Sinus rhythm upon presentation.  2. Successful electrical isolation and anatomical encircling of all four pulmonary veins with radiofrequency current. 3. No inducible arrhythmias following ablation both on and off of dobutamine 4. No early apparent complications. Andree Coss Camnitz,MD 11:37 AM 01/02/2023   { Assessment and Plan:   #  R Groin Hematoma #pAF s/p PVI 01/02/23 Enlarging R groin hematoma s/p PVI performed 01/02/23. No hematoma on L. No bruit, no evidence of arterial injury or extravasation on CT imaging. Pressure applied in ED and hematoma outlined for monitoring. Admitted to Cardiology for observation to ensure stability of hematoma in AM, stable CBC. - Margins of hematoma outlined - Active type and screen ordered - Repeat CBC in 6 hours (0500) - Xarelto held - Continue home flecainiade - Continue telemetry monitoring - Plan for likely discharge home tomorrow if hematoma and labs stable.   #HTN BP in ED 08-676 systolic / 19-50 diastolic.  - Home antihypertensives held overnight.    Severity of Illness: The appropriate patient status for this patient is OBSERVATION. Observation status is judged to be reasonable and necessary in order to provide the required intensity of service to ensure the patient's safety. The patient's presenting symptoms, physical exam findings, and  initial radiographic and laboratory data in the context of their medical condition is felt to place them at decreased risk for further clinical deterioration. Furthermore, it is anticipated that the patient will be medically stable for discharge from the hospital within 2 midnights of admission.    For questions or updates, please contact [place] Please consult [email or web address] for contact info under     Signed, Delorse Limber, MD  01/03/2023 3:39 AM

## 2023-01-03 NOTE — Progress Notes (Signed)
Right groin ultrasound  has been completed. Refer to [facility] under chart review to view preliminary results.   01/03/2023  11:29 AM Ferdinand Revoir, Bonnye Fava

## 2023-01-03 NOTE — ED Provider Notes (Signed)
Cardiology evaluated the patient and will admit for ops. They requested that we hold pressure on the wound again for 20 minutes which was completed. Patient provided with IV pain medicine and additional IV fluids.   Fatima Blank, MD 01/03/23 [phone]

## 2023-01-08 ENCOUNTER — Ambulatory Visit (HOSPITAL_COMMUNITY)
Admit: 2023-01-08 | Discharge: 2023-01-08 | Disposition: A | Discharge status: Home / Self Care | Payer: Medicare Other | Attending: Nurse Practitioner | Admitting: Nurse Practitioner

## 2023-01-08 ENCOUNTER — Encounter (HOSPITAL_COMMUNITY): Payer: Self-pay | Admitting: Nurse Practitioner

## 2023-01-08 DIAGNOSIS — I480 Paroxysmal atrial fibrillation: Secondary | ICD-10-CM

## 2023-01-08 DIAGNOSIS — T148XXA Other injury of unspecified body region, initial encounter: Secondary | ICD-10-CM

## 2023-01-08 LAB — CBC
HCT: 33.2 % — ABNORMAL LOW (ref 39.0–52.0)
Hemoglobin: 10.8 g/dL — ABNORMAL LOW (ref 13.0–17.0)
MCH: 30.3 pg (ref 26.0–34.0)
MCHC: 32.5 g/dL (ref 30.0–36.0)
MCV: 93 fL (ref 80.0–100.0)
Platelets: 363 10*3/uL (ref 150–400)
RBC: 3.57 MIL/uL — ABNORMAL LOW (ref 4.22–5.81)
RDW: 13.2 % (ref 11.5–15.5)
WBC: 9.2 10*3/uL (ref 4.0–10.5)
nRBC: 0 % (ref 0.0–0.2)

## 2023-01-08 NOTE — Progress Notes (Addendum)
Pt in today for groin check from  rt groin hematoma following ablation 1/11. He is still tender in the area with old bruising present extending down his anterior thigh. The hematoma has receded slightly within the marked area. He went to  [facility] yesterday and walked around the store without issues. CBC was drawn today. He has f/u here  in a few weeks. Encouraged to call the office if any concerns.  Auscultated  in SR today. Marland Kitchen

## 2023-01-30 ENCOUNTER — Encounter (HOSPITAL_COMMUNITY): Payer: Self-pay | Admitting: *Deleted

## 2023-01-30 ENCOUNTER — Ambulatory Visit (HOSPITAL_COMMUNITY)
Admission: RE | Admit: 2023-01-30 | Discharge: 2023-01-30 | Disposition: A | Discharge status: Home / Self Care | Payer: Medicare Other | Source: Ambulatory Visit | Attending: Nurse Practitioner | Admitting: Nurse Practitioner

## 2023-01-30 ENCOUNTER — Encounter (HOSPITAL_COMMUNITY): Payer: Self-pay | Admitting: Nurse Practitioner

## 2023-01-30 VITALS — BP 162/90 | HR 58 | Ht 72.0 in | Wt 251.2 lb

## 2023-01-30 DIAGNOSIS — D6869 Other thrombophilia: Secondary | ICD-10-CM

## 2023-01-30 DIAGNOSIS — I480 Paroxysmal atrial fibrillation: Secondary | ICD-10-CM | POA: Insufficient documentation

## 2023-01-30 DIAGNOSIS — R001 Bradycardia, unspecified: Secondary | ICD-10-CM | POA: Insufficient documentation

## 2023-01-30 NOTE — Progress Notes (Signed)
Primary Care Physician: Kathalene Frames, MD Referring Physician: Dr. Mosetta Pigeon ESA RADEN is a 72 y.o. male with a h/o afib that is one month s/p ablation. He did have a hematoma rt leg after the procedure but this has resolved. He is back to usual activities. No swallowing issues. He has not noted any afib and is feeling much better.   Today, he denies symptoms of palpitations, chest pain, shortness of breath, orthopnea, PND, lower extremity edema, dizziness, presyncope, syncope, or neurologic sequela. The patient is tolerating medications without difficulties and is otherwise without complaint today.   Past Medical History:  Diagnosis Date   Diabetes mellitus without complication ([place])    Hypertension    Persistent atrial fibrillation ([place])    chads2vasc is 2.  on xarelto   Stroke [facility])    Past Surgical History:  Procedure Laterality Date   ATRIAL FIBRILLATION ABLATION N/A 01/02/2023   Procedure: ATRIAL FIBRILLATION ABLATION;  Surgeon: Constance Haw, MD;  Location: [place] [facility];  Service: Cardiovascular;  Laterality: N/A;   CARDIOVERSION N/A 10/19/2015   Procedure: CARDIOVERSION;  Surgeon: Skeet Latch, MD;  Location: [facility] ENDOSCOPY;  Service: Cardiovascular;  Laterality: N/A;   KNEE ARTHROSCOPY      Current Outpatient Medications  Medication Sig Dispense Refill   acetaminophen (TYLENOL) 500 MG tablet Take 500 mg by mouth as needed for headache.     Calcium Carbonate Antacid (TUMS PO) Take 3 tablets by mouth as needed (heartburn).     Ciclopirox 1 % shampoo Apply 1 Application topically as needed (during the summer).     diltiazem (CARDIZEM) 30 MG tablet Take 1 tablet every 4 hours AS NEEDED for AFIB heart rate >100 as long as top blood pressure >100. (Patient taking differently: Take 30 mg by mouth every 4 (four) hours as needed (AFIB).) 45 tablet 1   flecainide (TAMBOCOR) 50 MG tablet Take 1 tablet (50 mg total) by mouth 2 (two) times daily. 180 tablet  1   fluticasone (FLONASE) 50 MCG/ACT nasal spray Place 2 sprays into both nostrils as needed for allergies.     irbesartan (AVAPRO) 150 MG tablet Take 150 mg by mouth daily.     metFORMIN (GLUCOPHAGE-XR) 500 MG 24 hr tablet Take 500-1,000 mg by mouth See admin instructions. Take 500 mg by mouth in the morning and 1000 mg by mouth at night     metoprolol (LOPRESSOR) 100 MG tablet Take 1 tablet (100 mg total) by mouth 2 (two) times daily.     XARELTO 20 MG TABS tablet Take 20 mg by mouth daily.      No current facility-administered medications for this encounter.    Allergies  Allergen Reactions   Altace [Ramipril] Cough   Cozaar [Losartan Potassium] Other (See Comments)    Leg pain   Fentanyl Other (See Comments)    BP dropped, passed out    Rosuvastatin Other (See Comments)    Causes legs to ache   Elemental Sulfur Rash    Might have meant (Sulfa??)   Penicillins Hives and Rash    Has patient had a PCN reaction causing immediate rash, facial/tongue/throat swelling, SOB or lightheadedness with hypotension: Yes Has patient had a PCN reaction causing severe rash involving mucus membranes or skin necrosis: No Has patient had a PCN reaction that required hospitalization No Has patient had a PCN reaction occurring within the last 10 years: No If all of the above answers are "NO", then may proceed  with Cephalosporin use.    Sulfa Antibiotics Hives and Rash    Allergy from childhood    Social History   Socioeconomic History   Marital status: Married    Spouse name: Not on file   Number of children: Not on file   Years of education: Not on file   Highest education level: Not on file  Occupational History   Not on file  Tobacco Use   Smoking status: Former   Smokeless tobacco: Never   Tobacco comments:    Quit 1997  Vaping Use   Vaping Use: Never used  Substance and Sexual Activity   Alcohol use: Yes    Alcohol/week: 3.0 standard drinks of alcohol    Types: 1 Glasses of  wine, 1 Cans of beer, 1 Shots of liquor per week    Comment: daily,limited socially   Drug use: No   Sexual activity: Not on file  Other Topics Concern   Not on file  Social History Narrative   Not on file   Social Determinants of Health   Financial Resource Strain: Not on file  Food Insecurity: Not on file  Transportation Needs: Not on file  Physical Activity: Not on file  Stress: Not on file  Social Connections: Not on file  Intimate Partner Violence: Not on file    Family History  Problem Relation Age of Onset   Heart disease Father    Heart failure Father     ROS- All systems are reviewed and negative except as per the HPI above  Physical Exam: Vitals:   01/30/23 1133  BP: (!) 162/90  Pulse: (!) 58  Weight: 113.9 kg  Height: 6' (1.829 m)   Wt Readings from Last 3 Encounters:  01/30/23 113.9 kg  01/02/23 111.1 kg  01/02/23 111.1 kg    Labs: Lab Results  Component Value Date   NA 134 (L) 01/02/2023   K 4.3 01/02/2023   CL 101 01/02/2023   CO2 26 01/02/2023   GLUCOSE 196 (H) 01/02/2023   BUN 15 01/02/2023   CREATININE 0.98 01/02/2023   CALCIUM 8.1 (L) 01/02/2023   Lab Results  Component Value Date   INR 1.11 05/03/2017   Lab Results  Component Value Date   CHOL 148 05/04/2017   HDL 44 05/04/2017   LDLCALC 79 05/04/2017   TRIG 126 05/04/2017     GEN- The patient is well appearing, alert and oriented x 3 today.   Head- normocephalic, atraumatic Eyes-  Sclera clear, conjunctiva pink Ears- hearing intact Oropharynx- clear Neck- supple, no JVP Lymph- no cervical lymphadenopathy Lungs- Clear to ausculation bilaterally, normal work of breathing Heart- Regular rate and rhythm, no murmurs, rubs or gallops, PMI not laterally displaced GI- soft, NT, ND, + BS Extremities- no clubbing, cyanosis, or edema MS- no significant deformity or atrophy Skin- no rash or lesion Psych- euthymic mood, full affect Neuro- strength and sensation are  intact  EKG-Vent. rate 58 BPM PR interval * ms QRS duration 86 ms QT/QTcB 398/390 ms P-R-T axes * 51 51 Junctional rhythm ( I feel this is SR)  Abnormal ECG When compared with ECG of 02-Jan-2023 12:11, PREVIOUS ECG IS PRESENT    Assessment and Plan:  1. Afib S/p afib ablation 01/02/23 He is maintaining  SR Rt groin hematoma is resolved    Continue flecainide 50 mg bid  Continue metoprolol tartrate 100 mg bid   2. CHA2DS2VASc  score is 6 Continue xarelto 20 mg daily, reminded not to miss  doses    F/u with Dr. Curt Bears 03/31/23  Geroge Baseman. Vola Beneke, [facility] [address] [place], [place] [address] [phone]

## 2023-02-27 DIAGNOSIS — Z08 Encounter for follow-up examination after completed treatment for malignant neoplasm: Secondary | ICD-10-CM | POA: Diagnosis not present

## 2023-02-27 DIAGNOSIS — L821 Other seborrheic keratosis: Secondary | ICD-10-CM | POA: Diagnosis not present

## 2023-02-27 DIAGNOSIS — D225 Melanocytic nevi of trunk: Secondary | ICD-10-CM | POA: Diagnosis not present

## 2023-02-27 DIAGNOSIS — L570 Actinic keratosis: Secondary | ICD-10-CM | POA: Diagnosis not present

## 2023-02-27 DIAGNOSIS — L814 Other melanin hyperpigmentation: Secondary | ICD-10-CM | POA: Diagnosis not present

## 2023-03-13 ENCOUNTER — Telehealth: Payer: Self-pay | Admitting: Cardiology

## 2023-03-13 MED ORDER — DILTIAZEM HCL 30 MG PO TABS: ORAL_TABLET | ORAL | 1 refills | Status: AC

## 2023-03-13 MED ORDER — FLECAINIDE ACETATE 50 MG PO TABS: 100.0000 mg | ORAL_TABLET | Freq: Two times a day (BID) | ORAL | 1 refills | Status: DC

## 2023-03-13 NOTE — Telephone Encounter (Signed)
Patient c/o Palpitations:  High priority if patient c/o lightheadedness, shortness of breath, or chest pain  How long have you had palpitations/irregular HR/ Afib? Are you having the symptoms now?  Afib started on 2/25 occurring once a week after ablation. Over the past few weeks he is going into afib every other night. No symptoms currently.  Are you currently experiencing lightheadedness, SOB or CP?  No   Do you have a history of afib (atrial fibrillation) or irregular heart rhythm?  Afib   Have you checked your BP or HR? (document readings if available):  HR in 120's, 130's - 134 currently  Are you experiencing any other symptoms?  Palpitations, occasional sweats, occasional lightheadedness

## 2023-03-13 NOTE — Telephone Encounter (Signed)
Pt called to report that he has been having on and off AFIB since 02/16/23... he had an Ablation 01/02/23... this last week has been significant... every other day lasting a while... HR up to the 130's. He has had dizziness, sweating... he has bene taking all of his meds including the PRN Diltiazem.   He says it happens mostly ion the evening after he tries to relax. He says hie wife feels his pulse manually and it feels irregular as with Afib.   I will forward to the Afib Clinic to see if he can be seen today possibly.

## 2023-03-13 NOTE — Telephone Encounter (Signed)
Per Adline Peals PA will increase flecainide to 100mg  BID follow up EKG next week. Pt can continue using PRN cardizem for elevated rates. Educated on post-ablation expectations regarding breakthrough afib. Pt will call if issues arise prior to next week.

## 2023-03-18 ENCOUNTER — Ambulatory Visit (HOSPITAL_COMMUNITY)
Admission: RE | Admit: 2023-03-18 | Discharge: 2023-03-18 | Disposition: A | Discharge status: Home / Self Care | Payer: Medicare Other | Source: Ambulatory Visit | Attending: Physician Assistant | Admitting: Physician Assistant

## 2023-03-18 DIAGNOSIS — Z79899 Other long term (current) drug therapy: Secondary | ICD-10-CM | POA: Diagnosis not present

## 2023-03-18 DIAGNOSIS — I480 Paroxysmal atrial fibrillation: Secondary | ICD-10-CM | POA: Diagnosis not present

## 2023-03-18 MED ORDER — FLECAINIDE ACETATE 100 MG PO TABS: 100.0000 mg | ORAL_TABLET | Freq: Two times a day (BID) | ORAL | 2 refills | Status: DC

## 2023-03-18 NOTE — Progress Notes (Signed)
Patient returns for ECG after increasing flecainide. ECG shows:  SB Vent. rate 53 BPM PR interval 188 ms QRS duration 100 ms QT/QTcB 428/401 ms  Patient has not had any further afib symptoms since increasing the dose. Continue flecainide 100 mg BID. Follow up with Dr Curt Bears as scheduled.

## 2023-03-31 ENCOUNTER — Ambulatory Visit: Payer: Medicare Other | Attending: Cardiology | Admitting: Cardiology

## 2023-03-31 ENCOUNTER — Encounter: Payer: Self-pay | Admitting: Cardiology

## 2023-03-31 VITALS — BP 118/76 | HR 51 | Ht 72.0 in | Wt 248.0 lb

## 2023-03-31 DIAGNOSIS — I480 Paroxysmal atrial fibrillation: Secondary | ICD-10-CM

## 2023-03-31 DIAGNOSIS — D6869 Other thrombophilia: Secondary | ICD-10-CM

## 2023-03-31 NOTE — Patient Instructions (Signed)
Medication Instructions:  °Your physician recommends that you continue on your current medications as directed. Please refer to the Current Medication list given to you today. ° °*If you need a refill on your cardiac medications before your next appointment, please call your pharmacy* ° ° °Lab Work: °None ordered ° ° °Testing/Procedures: °None ordered ° ° °Follow-Up: °At [facility], you and your health needs are our priority.  As part of our continuing mission to provide you with exceptional heart care, we have created designated Provider Care Teams.  These Care Teams include your primary Cardiologist (physician) and Advanced Practice Providers (APPs -  Physician Assistants and Nurse Practitioners) who all work together to provide you with the care you need, when you need it. ° °Your next appointment:   °6 month(s) ° °The format for your next appointment:   °In Person ° °Provider:   °Will Camnitz, MD ° ° ° °Thank you for choosing [facility]!! ° ° °Rhone Ozaki, RN °[phone] °  °

## 2023-03-31 NOTE — Progress Notes (Signed)
Electrophysiology Office Note   Date:  03/31/2023   ID:  Gerald Baker, [name] 16-Oct-1951, MRN [id number]  PCP:  Emilio Aspen, MD  Cardiologist:  Katrinka Blazing Primary Electrophysiologist:  Danesha Kirchoff [name], MD    Chief Complaint: AF   History of Present Illness: Gerald Baker is a 72 y.o. male who is being seen today for the evaluation of AF at the request of Emilio Aspen, *. Presenting today for electrophysiology evaluation.  Significant paroxysmal atrial fibrillation, hypertension, LVH.  He was having more frequent episodes of atrial fibrillation on a pill in the pocket flecainide.  He was switched to a daily flecainide.  He is now status post ablation on 01/02/2023.  Unfortunately post ablation he has had more frequent episodes of atrial fibrillation and flecainide was increased.  Since the increased dose he has done well without further episodes.  He thinks that he was in atrial fibrillation, but not did not have an EKG or rhythm strip done.  Today, denies symptoms of palpitations, chest pain, shortness of breath, orthopnea, PND, lower extremity edema, claudication, dizziness, presyncope, syncope, bleeding, or neurologic sequela. The patient is tolerating medications without difficulties.      Past Medical History:  Diagnosis Date   Diabetes mellitus without complication    Hypertension    Persistent atrial fibrillation    chads2vasc is 2.  on xarelto   Stroke    Past Surgical History:  Procedure Laterality Date   ATRIAL FIBRILLATION ABLATION N/A 01/02/2023   Procedure: ATRIAL FIBRILLATION ABLATION;  Surgeon: Regan Lemming, MD;  Location: [facility] INVASIVE CV LAB;  Service: Cardiovascular;  Laterality: N/A;   CARDIOVERSION N/A 10/19/2015   Procedure: CARDIOVERSION;  Surgeon: Chilton Si, MD;  Location: [facility] ENDOSCOPY;  Service: Cardiovascular;  Laterality: N/A;   KNEE ARTHROSCOPY       Current Outpatient Medications  Medication Sig Dispense Refill    acetaminophen (TYLENOL) 500 MG tablet Take 500 mg by mouth as needed for headache.     Calcium Carbonate Antacid (TUMS PO) Take 3 tablets by mouth as needed (heartburn).     Ciclopirox 1 % shampoo Apply 1 Application topically as needed (during the summer).     diltiazem (CARDIZEM) 30 MG tablet Take 1 tablet every 4 hours AS NEEDED for AFIB heart rate >100 as long as top blood pressure >100. 45 tablet 1   flecainide (TAMBOCOR) 100 MG tablet Take 1 tablet (100 mg total) by mouth 2 (two) times daily. 180 tablet 2   fluticasone (FLONASE) 50 MCG/ACT nasal spray Place 2 sprays into both nostrils as needed for allergies.     irbesartan (AVAPRO) 150 MG tablet Take 150 mg by mouth daily.     metFORMIN (GLUCOPHAGE-XR) 500 MG 24 hr tablet Take 500-1,000 mg by mouth See admin instructions. Take 500 mg by mouth in the morning and 1000 mg by mouth at night     metoprolol (LOPRESSOR) 100 MG tablet Take 1 tablet (100 mg total) by mouth 2 (two) times daily.     XARELTO 20 MG TABS tablet Take 20 mg by mouth daily.      No current facility-administered medications for this visit.    Allergies:   Altace [ramipril], Cozaar [losartan potassium], Fentanyl, Rosuvastatin, Elemental sulfur, Penicillins, and Sulfa antibiotics   Social History:  The patient  reports that he has quit smoking. He has never used smokeless tobacco. He reports current alcohol use of about 3.0 standard drinks of alcohol per week. He reports that  he does not use drugs.   Family History:  The patient's family history includes Heart disease in his father; Heart failure in his father.   ROS:  Please see the history of present illness.   Otherwise, review of systems is positive for none.   All other systems are reviewed and negative.   PHYSICAL EXAM: VS:  BP 118/76   Pulse (!) 51   Ht 6' (1.829 m)   Wt 248 lb (112.5 kg)   SpO2 98%   BMI 33.63 kg/m  , BMI Body mass index is 33.63 kg/m. GEN: Well nourished, well developed, in no acute  distress  HEENT: normal  Neck: no JVD, carotid bruits, or masses Cardiac: RRR; no murmurs, rubs, or gallops,no edema  Respiratory:  clear to auscultation bilaterally, normal work of breathing GI: soft, nontender, nondistended, + BS MS: no deformity or atrophy  Skin: warm and dry Neuro:  Strength and sensation are intact Psych: euthymic mood, full affect  EKG:  EKG is ordered today. Personal review of the ekg ordered shows sinus rhythm   Recent Labs: 01/02/2023: BUN 15; Creatinine, Ser 0.98; Potassium 4.3; Sodium 134 01/08/2023: Hemoglobin 10.8; Platelets 363    Lipid Panel     Component Value Date/Time   CHOL 148 05/04/2017 0528   TRIG 126 05/04/2017 0528   HDL 44 05/04/2017 0528   CHOLHDL 3.4 05/04/2017 0528   VLDL 25 05/04/2017 0528   LDLCALC 79 05/04/2017 0528     Wt Readings from Last 3 Encounters:  03/31/23 248 lb (112.5 kg)  01/30/23 251 lb 3.2 oz (113.9 kg)  01/02/23 244 lb 14.9 oz (111.1 kg)      Other studies Reviewed: Additional studies/ records that were reviewed today include: TTE 09/13/21  Review of the above records today demonstrates:   1. Left ventricular ejection fraction, by estimation, is 55 to 60%. The  left ventricle has normal function. The left ventricle has no regional  wall motion abnormalities. Left ventricular diastolic parameters are  indeterminate.   2. Right ventricular systolic function is low normal. The right  ventricular size is mildly enlarged. There is mildly elevated pulmonary  artery systolic pressure. The estimated right ventricular systolic  pressure is 36.7 mmHg.   3. Left atrial size was mildly dilated.   4. Right atrial size was mildly dilated.   5. The mitral valve is normal in structure. Trivial mitral valve  regurgitation. No evidence of mitral stenosis.   6. The aortic valve is grossly normal. Aortic valve regurgitation is not  visualized. No aortic stenosis is present.    ASSESSMENT AND PLAN:  1.  Paroxysmal  atrial fibrillation: Currently on flecainide and Xarelto.  CHA2DS2-VASc of 5.  Is status post ablation 01/02/2023.  He had more frequent episodes of atrial fibrillation and flecainide was increased.  Since the increased dose he has had no further issues.  Elizebeth Kluesner continue with current management.  2.  Hypertension: Currently well-controlled  3.  Secondary hypercoagulable state: Currently on Xarelto for atrial fibrillation  4.  Coronary artery calcium: Elevated calcium score.  Has plans for lipids to be checked by primary physician later this month.  Goal LDL less than 70.  Current medicines are reviewed at length with the patient today.   The patient does not have concerns regarding his medicines.  The following changes were made today: None  Labs/ tests ordered today include:  Orders Placed This Encounter  Procedures   EKG 12-Lead     Disposition:  FU 6 months  Signed, Auston Halfmann Jorja LoaMartin Carmeron Heady, MD  03/31/2023 1:24 PM     [facility] [address] [place] [address] [phone] (office) [phone] (fax)

## 2023-04-15 DIAGNOSIS — Z125 Encounter for screening for malignant neoplasm of prostate: Secondary | ICD-10-CM | POA: Diagnosis not present

## 2023-04-15 DIAGNOSIS — I10 Essential (primary) hypertension: Secondary | ICD-10-CM | POA: Diagnosis not present

## 2023-04-15 DIAGNOSIS — I480 Paroxysmal atrial fibrillation: Secondary | ICD-10-CM | POA: Diagnosis not present

## 2023-04-15 DIAGNOSIS — Z1331 Encounter for screening for depression: Secondary | ICD-10-CM | POA: Diagnosis not present

## 2023-04-15 DIAGNOSIS — Z0000 Encounter for general adult medical examination without abnormal findings: Secondary | ICD-10-CM | POA: Diagnosis not present

## 2023-04-15 DIAGNOSIS — E1169 Type 2 diabetes mellitus with other specified complication: Secondary | ICD-10-CM | POA: Diagnosis not present

## 2023-04-15 DIAGNOSIS — K909 Intestinal malabsorption, unspecified: Secondary | ICD-10-CM | POA: Diagnosis not present

## 2023-04-15 DIAGNOSIS — E1136 Type 2 diabetes mellitus with diabetic cataract: Secondary | ICD-10-CM | POA: Diagnosis not present

## 2023-04-15 DIAGNOSIS — Z79899 Other long term (current) drug therapy: Secondary | ICD-10-CM | POA: Diagnosis not present

## 2023-04-15 LAB — LAB REPORT - SCANNED
A1c: 6.6
Creatinine, POC: 119 mg/dL
EGFR: 87
Microalb Creat Ratio: 12.2
Microalbumin, Urine: 1.44

## 2023-05-26 ENCOUNTER — Encounter: Payer: Self-pay | Admitting: Cardiology

## 2023-05-27 DIAGNOSIS — Z8679 Personal history of other diseases of the circulatory system: Secondary | ICD-10-CM | POA: Diagnosis not present

## 2023-05-28 DIAGNOSIS — D6869 Other thrombophilia: Secondary | ICD-10-CM | POA: Diagnosis not present

## 2023-05-28 DIAGNOSIS — Z8673 Personal history of transient ischemic attack (TIA), and cerebral infarction without residual deficits: Secondary | ICD-10-CM | POA: Diagnosis not present

## 2023-06-03 DIAGNOSIS — H5213 Myopia, bilateral: Secondary | ICD-10-CM | POA: Diagnosis not present

## 2023-06-17 DIAGNOSIS — Z86010 Personal history of colonic polyps: Secondary | ICD-10-CM | POA: Diagnosis not present

## 2023-06-17 DIAGNOSIS — K635 Polyp of colon: Secondary | ICD-10-CM | POA: Diagnosis not present

## 2023-06-17 DIAGNOSIS — K5730 Diverticulosis of large intestine without perforation or abscess without bleeding: Secondary | ICD-10-CM | POA: Diagnosis not present

## 2023-06-17 DIAGNOSIS — Z09 Encounter for follow-up examination after completed treatment for conditions other than malignant neoplasm: Secondary | ICD-10-CM | POA: Diagnosis not present

## 2023-06-19 DIAGNOSIS — K635 Polyp of colon: Secondary | ICD-10-CM | POA: Diagnosis not present

## 2023-06-19 DIAGNOSIS — K080 Exfoliation of teeth due to systemic causes: Secondary | ICD-10-CM | POA: Diagnosis not present

## 2023-07-23 DIAGNOSIS — L814 Other melanin hyperpigmentation: Secondary | ICD-10-CM | POA: Diagnosis not present

## 2023-07-23 DIAGNOSIS — L821 Other seborrheic keratosis: Secondary | ICD-10-CM | POA: Diagnosis not present

## 2023-07-23 DIAGNOSIS — D225 Melanocytic nevi of trunk: Secondary | ICD-10-CM | POA: Diagnosis not present

## 2023-07-23 DIAGNOSIS — Z08 Encounter for follow-up examination after completed treatment for malignant neoplasm: Secondary | ICD-10-CM | POA: Diagnosis not present

## 2023-07-29 DIAGNOSIS — K080 Exfoliation of teeth due to systemic causes: Secondary | ICD-10-CM | POA: Diagnosis not present

## 2023-10-01 ENCOUNTER — Ambulatory Visit: Payer: Medicare Other | Admitting: Cardiology

## 2023-10-14 DIAGNOSIS — E1169 Type 2 diabetes mellitus with other specified complication: Secondary | ICD-10-CM | POA: Diagnosis not present

## 2023-10-14 DIAGNOSIS — E1136 Type 2 diabetes mellitus with diabetic cataract: Secondary | ICD-10-CM | POA: Diagnosis not present

## 2023-10-14 DIAGNOSIS — E538 Deficiency of other specified B group vitamins: Secondary | ICD-10-CM | POA: Diagnosis not present

## 2023-10-14 DIAGNOSIS — I110 Hypertensive heart disease with heart failure: Secondary | ICD-10-CM | POA: Diagnosis not present

## 2023-10-14 DIAGNOSIS — I480 Paroxysmal atrial fibrillation: Secondary | ICD-10-CM | POA: Diagnosis not present

## 2023-10-14 DIAGNOSIS — I5032 Chronic diastolic (congestive) heart failure: Secondary | ICD-10-CM | POA: Diagnosis not present

## 2023-11-10 ENCOUNTER — Encounter: Payer: Self-pay | Admitting: Cardiology

## 2023-11-10 ENCOUNTER — Ambulatory Visit: Payer: Medicare Other | Attending: Cardiology | Admitting: Cardiology

## 2023-11-10 VITALS — BP 140/80 | HR 57 | Ht 72.0 in | Wt 253.0 lb

## 2023-11-10 DIAGNOSIS — I4819 Other persistent atrial fibrillation: Secondary | ICD-10-CM | POA: Diagnosis not present

## 2023-11-10 DIAGNOSIS — D6869 Other thrombophilia: Secondary | ICD-10-CM

## 2023-11-10 DIAGNOSIS — I10 Essential (primary) hypertension: Secondary | ICD-10-CM | POA: Diagnosis not present

## 2023-11-10 NOTE — Patient Instructions (Signed)
Medication Instructions:  Your physician recommends that you continue on your current medications as directed. Please refer to the Current Medication list given to you today.  *If you need a refill on your cardiac medications before your next appointment, please call your pharmacy*   Lab Work: None ordered   Testing/Procedures: None ordered   Follow-Up: At [facility], you and your health needs are our priority.  As part of our continuing mission to provide you with exceptional heart care, we have created designated Provider Care Teams.  These Care Teams include your primary Cardiologist (physician) and Advanced Practice Providers (APPs -  Physician Assistants and Nurse Practitioners) who all work together to provide you with the care you need, when you need it.  Your next appointment:   6 month(s)  The format for your next appointment:   In Person  Provider:   You will follow up in the Atrial Fibrillation Clinic located at [facility]. Your provider will be: Clint R. Fenton, PA-C or Lake Bells, PA-C    Thank you for choosing [facility]!!   Dory Horn, RN [phone]

## 2023-11-10 NOTE — Progress Notes (Signed)
  Electrophysiology Office Note:   Date:  11/10/2023  ID:  NORLAN KAPRAL, DOB [birth year], MRN [id number]  Primary Cardiologist: Lesleigh Noe, MD (Inactive) Electrophysiologist: Shandel Busic [name], MD      History of Present Illness:   Gerald Baker is a 72 y.o. male with h/o atrial fibrillation, hypertension, diabetes, CVA seen today for routine electrophysiology followup.   Since last being seen in our clinic the patient reports doing overall well.  He has no chest pain or shortness of breath.  He has had 3 episodes of atrial fibrillation since last being seen, all of which been short-lived.  He is happy with his control.  He is starting to exercise.  he denies chest pain, palpitations, dyspnea, PND, orthopnea, nausea, vomiting, dizziness, syncope, edema, weight gain, or early satiety.   Review of systems complete and found to be negative unless listed in HPI.   EP Information / Studies Reviewed:    EKG is ordered today. Personal review as below.  EKG Interpretation Date/Time:  Monday November 10 2023 14:43:01 EST Ventricular Rate:  57 PR Interval:  174 QRS Duration:  84 QT Interval:  406 QTC Calculation: 395 R Axis:   55  Text Interpretation: Sinus bradycardia When compared with ECG of 18-Mar-2023 11:09, No significant change was found Confirmed by Allon Costlow ([id number]) on 11/10/2023 2:52:28 PM     Risk Assessment/Calculations:    CHA2DS2-VASc Score = 4   This indicates a 4.8% annual risk of stroke. The patient's score is based upon: CHF History: 1 HTN History: 1 Diabetes History: 1 Stroke History: 0 Vascular Disease History: 0 Age Score: 1 Gender Score: 0            Physical Exam:   VS:  BP (!) 140/80 (BP Location: Left Arm, Patient Position: Sitting, Cuff Size: Large)   Pulse (!) 57   Ht 6' (1.829 m)   Wt 253 lb (114.8 kg)   SpO2 96%   BMI 34.31 kg/m    Wt Readings from Last 3 Encounters:  11/10/23 253 lb (114.8 kg)  03/31/23 248 lb (112.5 kg)   01/30/23 251 lb 3.2 oz (113.9 kg)     GEN: Well nourished, well developed in no acute distress NECK: No JVD; No carotid bruits CARDIAC: Regular rate and rhythm, no murmurs, rubs, gallops RESPIRATORY:  Clear to auscultation without rales, wheezing or rhonchi  ABDOMEN: Soft, non-tender, non-distended EXTREMITIES:  No edema; No deformity   ASSESSMENT AND PLAN:    Paroxysmal atrial fibrillation: Currently on Xarelto.  Post ablation 01/02/2023.  Has unfortunately continued to have episodes of atrial fibrillation.  He has episodes every few months.  They are short-lived.  Kailynn Satterly continue with current management.  2.  Secondary hypercoagulable state: Continue Xarelto for atrial fibrillation  3.  Hypertension: Managed by primary physician  4.  Elevated coronary calcium: Goal LDL less than 70.  Continue statin  Follow up with Afib Clinic in 6 months  Signed, Verley Pariseau [name], MD

## 2023-11-12 DIAGNOSIS — I110 Hypertensive heart disease with heart failure: Secondary | ICD-10-CM | POA: Diagnosis not present

## 2023-11-12 DIAGNOSIS — I480 Paroxysmal atrial fibrillation: Secondary | ICD-10-CM | POA: Diagnosis not present

## 2023-11-12 DIAGNOSIS — E119 Type 2 diabetes mellitus without complications: Secondary | ICD-10-CM | POA: Diagnosis not present

## 2023-11-12 DIAGNOSIS — I5032 Chronic diastolic (congestive) heart failure: Secondary | ICD-10-CM | POA: Diagnosis not present

## 2023-11-26 DIAGNOSIS — I10 Essential (primary) hypertension: Secondary | ICD-10-CM | POA: Diagnosis not present

## 2023-12-25 ENCOUNTER — Other Ambulatory Visit (HOSPITAL_COMMUNITY): Payer: Self-pay | Admitting: *Deleted

## 2023-12-25 MED ORDER — FLECAINIDE ACETATE 100 MG PO TABS: 100.0000 mg | ORAL_TABLET | Freq: Two times a day (BID) | ORAL | 2 refills | Status: DC

## 2023-12-30 ENCOUNTER — Other Ambulatory Visit (HOSPITAL_COMMUNITY): Payer: Self-pay | Admitting: *Deleted

## 2023-12-30 MED ORDER — FLECAINIDE ACETATE 100 MG PO TABS: 100.0000 mg | ORAL_TABLET | Freq: Two times a day (BID) | ORAL | 2 refills | Status: DC

## 2024-01-07 DIAGNOSIS — K080 Exfoliation of teeth due to systemic causes: Secondary | ICD-10-CM | POA: Diagnosis not present

## 2024-01-27 DIAGNOSIS — C44319 Basal cell carcinoma of skin of other parts of face: Secondary | ICD-10-CM | POA: Diagnosis not present

## 2024-01-27 DIAGNOSIS — L570 Actinic keratosis: Secondary | ICD-10-CM | POA: Diagnosis not present

## 2024-03-09 DIAGNOSIS — C44319 Basal cell carcinoma of skin of other parts of face: Secondary | ICD-10-CM | POA: Diagnosis not present

## 2024-03-09 DIAGNOSIS — L905 Scar conditions and fibrosis of skin: Secondary | ICD-10-CM | POA: Diagnosis not present

## 2024-04-19 DIAGNOSIS — Z79899 Other long term (current) drug therapy: Secondary | ICD-10-CM | POA: Diagnosis not present

## 2024-04-19 DIAGNOSIS — I10 Essential (primary) hypertension: Secondary | ICD-10-CM | POA: Diagnosis not present

## 2024-04-19 DIAGNOSIS — E1159 Type 2 diabetes mellitus with other circulatory complications: Secondary | ICD-10-CM | POA: Diagnosis not present

## 2024-04-19 DIAGNOSIS — Z125 Encounter for screening for malignant neoplasm of prostate: Secondary | ICD-10-CM | POA: Diagnosis not present

## 2024-04-19 DIAGNOSIS — E559 Vitamin D deficiency, unspecified: Secondary | ICD-10-CM | POA: Diagnosis not present

## 2024-04-19 DIAGNOSIS — E1169 Type 2 diabetes mellitus with other specified complication: Secondary | ICD-10-CM | POA: Diagnosis not present

## 2024-04-19 DIAGNOSIS — Z0000 Encounter for general adult medical examination without abnormal findings: Secondary | ICD-10-CM | POA: Diagnosis not present

## 2024-04-19 DIAGNOSIS — Z23 Encounter for immunization: Secondary | ICD-10-CM | POA: Diagnosis not present

## 2024-04-19 DIAGNOSIS — I5032 Chronic diastolic (congestive) heart failure: Secondary | ICD-10-CM | POA: Diagnosis not present

## 2024-04-19 DIAGNOSIS — D6869 Other thrombophilia: Secondary | ICD-10-CM | POA: Diagnosis not present

## 2024-04-19 DIAGNOSIS — Z1331 Encounter for screening for depression: Secondary | ICD-10-CM | POA: Diagnosis not present

## 2024-04-23 DIAGNOSIS — E875 Hyperkalemia: Secondary | ICD-10-CM | POA: Diagnosis not present

## 2024-04-23 DIAGNOSIS — E538 Deficiency of other specified B group vitamins: Secondary | ICD-10-CM | POA: Diagnosis not present

## 2024-05-10 ENCOUNTER — Encounter (HOSPITAL_COMMUNITY): Payer: Self-pay | Admitting: Physician Assistant

## 2024-05-10 ENCOUNTER — Ambulatory Visit (HOSPITAL_COMMUNITY)
Admission: RE | Admit: 2024-05-10 | Discharge: 2024-05-10 | Disposition: A | Discharge status: Home / Self Care | Payer: Medicare Other | Source: Ambulatory Visit | Attending: Physician Assistant | Admitting: Physician Assistant

## 2024-05-10 VITALS — BP 160/100 | HR 59 | Ht 72.0 in | Wt 257.4 lb

## 2024-05-10 DIAGNOSIS — I4819 Other persistent atrial fibrillation: Secondary | ICD-10-CM | POA: Diagnosis not present

## 2024-05-10 DIAGNOSIS — Z5181 Encounter for therapeutic drug level monitoring: Secondary | ICD-10-CM

## 2024-05-10 DIAGNOSIS — Z79899 Other long term (current) drug therapy: Secondary | ICD-10-CM | POA: Diagnosis not present

## 2024-05-10 DIAGNOSIS — D6869 Other thrombophilia: Secondary | ICD-10-CM

## 2024-05-10 NOTE — Progress Notes (Signed)
 Primary Care Physician: Benedetta Bradley, MD Primary Cardiologist: Mickiel Albany, MD (Inactive) Electrophysiologist: Will Cortland Ding, MD  Referring Physician: Dr Gerre Kraft Gerald Baker is a 73 y.o. male with a history of HTN, DM, CVA, atrial fibrillation who presents for follow up in the [facility].  The patient is s/p afib ablation 01/02/23 with Dr Lawana Pray. He has been maintained on flecainide . Patient is on Xarelto  for stroke prevention.   Patient presents today for follow up for atrial fibrillation and flecainide  monitoring. He reports that he has done well since his last visit. He has not had any symptoms of afib for almost a year. No bleeding issues on anticoagulation.   Today, he denies symptoms of palpitations, chest pain, shortness of breath, orthopnea, PND, lower extremity edema, dizziness, presyncope, syncope, snoring, daytime somnolence, bleeding, or neurologic sequela. The patient is tolerating medications without difficulties and is otherwise without complaint today.    Atrial Fibrillation Risk Factors:  he does not have symptoms or diagnosis of sleep apnea. he does not have a history of rheumatic fever.   Atrial Fibrillation Management history:  Previous antiarrhythmic drugs: flecainide   Previous cardioversions: 2016 Previous ablations: 01/02/23 Anticoagulation history: Xarelto   ROS- All systems are reviewed and negative except as per the HPI above.  Past Medical History:  Diagnosis Date   Diabetes mellitus without complication (HCC)    Hypertension    Persistent atrial fibrillation (HCC)    chads2vasc is 2.  on xarelto    Stroke [facility])     Current Outpatient Medications  Medication Sig Dispense Refill   acetaminophen  (TYLENOL ) 500 MG tablet Take 500 mg by mouth as needed for headache.     Calcium Carbonate Antacid (TUMS PO) Take 3 tablets by mouth as needed (heartburn).     Ciclopirox 1 % shampoo Apply 1 Application  topically as needed (during the summer).     diltiazem  (CARDIZEM ) 30 MG tablet Take 1 tablet every 4 hours AS NEEDED for AFIB heart rate >100 as long as top blood pressure >100. 45 tablet 1   flecainide  (TAMBOCOR ) 100 MG tablet Take 1 tablet (100 mg total) by mouth 2 (two) times daily. 180 tablet 2   fluticasone (FLONASE) 50 MCG/ACT nasal spray Place 2 sprays into both nostrils as needed for allergies.     irbesartan (AVAPRO) 150 MG tablet Take 150 mg by mouth daily.     metFORMIN  (GLUCOPHAGE -XR) 500 MG 24 hr tablet Take 500-1,000 mg by mouth See admin instructions. Take 500 mg by mouth in the morning and 1000 mg by mouth at night     metoprolol  (LOPRESSOR ) 100 MG tablet Take 1 tablet (100 mg total) by mouth 2 (two) times daily.     XARELTO  20 MG TABS tablet Take 20 mg by mouth daily.      No current facility-administered medications for this encounter.    Physical Exam: BP (!) 160/100   Pulse (!) 59   Ht 6' (1.829 m)   Wt 116.8 kg   BMI 34.91 kg/m   GEN: Well nourished, well developed in no acute distress CARDIAC: Regular rate and rhythm, no murmurs, rubs, gallops RESPIRATORY:  Clear to auscultation without rales, wheezing or rhonchi  ABDOMEN: Soft, non-tender, non-distended EXTREMITIES:  No edema; No deformity   Wt Readings from Last 3 Encounters:  05/10/24 116.8 kg  11/10/23 114.8 kg  03/31/23 112.5 kg     EKG today demonstrates  SR Vent. rate 59  BPM PR interval 200 ms QRS duration 86 ms QT/QTcB 406/401 ms  Echo 09/13/21 demonstrated   1. Left ventricular ejection fraction, by estimation, is 55 to 60%. The  left ventricle has normal function. The left ventricle has no regional  wall motion abnormalities. Left ventricular diastolic parameters are  indeterminate.   2. Right ventricular systolic function is low normal. The right  ventricular size is mildly enlarged. There is mildly elevated pulmonary  artery systolic pressure. The estimated right ventricular systolic   pressure is 36.7 mmHg.   3. Left atrial size was mildly dilated.   4. Right atrial size was mildly dilated.   5. The mitral valve is normal in structure. Trivial mitral valve  regurgitation. No evidence of mitral stenosis.   6. The aortic valve is grossly normal. Aortic valve regurgitation is not  visualized. No aortic stenosis is present.   Comparison(s): No significant change from prior study.   Conclusion(s)/Recommendation(s): Otherwise normal echocardiogram, with  minor abnormalities described in the report.    CHA2DS2-VASc Score = 6  The patient's score is based upon: CHF History: 0 HTN History: 1 Diabetes History: 1 Stroke History: 2 Vascular Disease History: 1 Age Score: 1 Gender Score: 0       ASSESSMENT AND PLAN: Paroxysmal Atrial Fibrillation (ICD10:  I48.0) The patient's CHA2DS2-VASc score is 6, indicating a 9.7% annual risk of stroke.   Patient appears to be maintaining SR Continue flecainide  100 mg BID Continue diltiazem  30 mg PRN q 4 hours for heart racing Continue Xarelto  20 mg daily  Secondary Hypercoagulable State (ICD10:  D68.69) The patient is at significant risk for stroke/thromboembolism based upon his CHA2DS2-VASc Score of 6.  Continue Rivaroxaban  (Xarelto ). No bleeding issues.   High Risk Medication Monitoring (ICD 10: Z79.899) Intervals on ECG acceptable for flecainide  monitoring.     CAD CAC score 651 Stress test 2022 low risk No anginal symptoms  HTN Elevated today, typically 130s/80s at home. No changes today.   Obesity Body mass index is 34.91 kg/m.  Encouraged lifestyle modification    Follow up in the AF clinic in 6 months.         Myrtha Ates PA-C [facility] [address] [place], [place] [address] [phone]

## 2024-05-24 DIAGNOSIS — R748 Abnormal levels of other serum enzymes: Secondary | ICD-10-CM | POA: Diagnosis not present

## 2024-05-24 DIAGNOSIS — R059 Cough, unspecified: Secondary | ICD-10-CM | POA: Diagnosis not present

## 2024-06-03 DIAGNOSIS — H5213 Myopia, bilateral: Secondary | ICD-10-CM | POA: Diagnosis not present

## 2024-07-28 DIAGNOSIS — K080 Exfoliation of teeth due to systemic causes: Secondary | ICD-10-CM | POA: Diagnosis not present

## 2024-08-06 DIAGNOSIS — L821 Other seborrheic keratosis: Secondary | ICD-10-CM | POA: Diagnosis not present

## 2024-08-06 DIAGNOSIS — Z08 Encounter for follow-up examination after completed treatment for malignant neoplasm: Secondary | ICD-10-CM | POA: Diagnosis not present

## 2024-08-06 DIAGNOSIS — L814 Other melanin hyperpigmentation: Secondary | ICD-10-CM | POA: Diagnosis not present

## 2024-08-06 DIAGNOSIS — D225 Melanocytic nevi of trunk: Secondary | ICD-10-CM | POA: Diagnosis not present

## 2024-08-06 DIAGNOSIS — L570 Actinic keratosis: Secondary | ICD-10-CM | POA: Diagnosis not present

## 2024-08-06 DIAGNOSIS — D044 Carcinoma in situ of skin of scalp and neck: Secondary | ICD-10-CM | POA: Diagnosis not present

## 2024-09-01 DIAGNOSIS — K080 Exfoliation of teeth due to systemic causes: Secondary | ICD-10-CM | POA: Diagnosis not present

## 2024-09-03 ENCOUNTER — Other Ambulatory Visit (HOSPITAL_COMMUNITY): Payer: Self-pay | Admitting: *Deleted

## 2024-09-03 MED ORDER — FLECAINIDE ACETATE 100 MG PO TABS: 100.0000 mg | ORAL_TABLET | Freq: Two times a day (BID) | ORAL | 2 refills | Status: AC

## 2024-10-18 DIAGNOSIS — E1169 Type 2 diabetes mellitus with other specified complication: Secondary | ICD-10-CM | POA: Diagnosis not present

## 2024-10-18 DIAGNOSIS — E1159 Type 2 diabetes mellitus with other circulatory complications: Secondary | ICD-10-CM | POA: Diagnosis not present

## 2024-10-18 DIAGNOSIS — I10 Essential (primary) hypertension: Secondary | ICD-10-CM | POA: Diagnosis not present

## 2024-10-18 DIAGNOSIS — I480 Paroxysmal atrial fibrillation: Secondary | ICD-10-CM | POA: Diagnosis not present

## 2024-11-02 NOTE — Progress Notes (Unsigned)
  Electrophysiology Office Note:   Date:  11/03/2024  ID:  Gerald Baker, Gerald Baker 04/10/1951, MRN [id number]  Primary Cardiologist: Victory LELON Claudene DOUGLAS, MD (Inactive) Electrophysiologist: Will Gladis Norton, MD   Electrophysiologist:  Will Gladis Norton, MD      History of Present Illness:   Gerald Baker is a 73 y.o. male with h/o atrial fibrillation, CVA, hypertension, diabetes seen today for routine electrophysiology followup.   Since last being seen in our clinic the patient reports doing well. No known afib for over a year (wears Apple Watch daily). he denies chest pain, palpitations, dyspnea, PND, orthopnea, nausea, vomiting, dizziness, syncope, edema, weight gain, or early satiety.   Review of systems complete and found to be negative unless listed in HPI.   EP Information / Studies Reviewed:    EKG is ordered today. Personal review as below.  EKG Interpretation Date/Time:  Wednesday November 03 2024 11:54:07 EST Ventricular Rate:  53 PR Interval:  180 QRS Duration:  86 QT Interval:  418 QTC Calculation: 392 R Axis:   34  Text Interpretation: Sinus bradycardia When compared with ECG of 10-May-2024 11:06, No significant change was found Confirmed by Trudy Birmingham [phone]) on 11/03/2024 12:01:38 PM    Arrhythmia/Device History No specialty comments available.   Physical Exam:   VS:  BP 136/76   Pulse (!) 53   Ht 6' (1.829 m)   Wt 248 lb 12.8 oz (112.9 kg)   SpO2 99%   BMI 33.74 kg/m    Wt Readings from Last 3 Encounters:  11/03/24 248 lb 12.8 oz (112.9 kg)  05/10/24 257 lb 6.4 oz (116.8 kg)  11/10/23 253 lb (114.8 kg)     GEN: No acute distress NECK: No JVD; No carotid bruits CARDIAC: Regular rate and rhythm, no murmurs, rubs, gallops RESPIRATORY:  Clear to auscultation without rales, wheezing or rhonchi  ABDOMEN: Soft, non-tender, non-distended EXTREMITIES:  No edema; No deformity   ASSESSMENT AND PLAN:    Paroxysmal atrial fibrillation Secondary  hypercoagulable state No evidence of recurrent afib, patient feeling well. Stable intervals on ECG. Continue Flecainide  100mg  BID, Diltiazem  30mg  PRN q4hr for RVR. Patient on high dose Metoprolol  Tartrate 100mg  BID per PCP for hypertension. Given borderline low HR, may need to consider lower dose, okay for lower daily dose from EP perspective, no minimum requirement for Flecainide  as long as on small dose. Continue Xarelto  20mg , will have patient forward latest labs from PCP.  Hypertension Blood pressure is well-controlled on current antihypertensive regimen. Continue current medications and dosing per PCP.         Follow up with Dr. Norton in 6 months  Signed, [place] Trudy, PA-C

## 2024-11-03 ENCOUNTER — Encounter: Payer: Self-pay | Admitting: Cardiology

## 2024-11-03 ENCOUNTER — Ambulatory Visit: Attending: Student in an Organized Health Care Education/Training Program | Admitting: Cardiology

## 2024-11-03 VITALS — BP 136/76 | HR 53 | Ht 72.0 in | Wt 248.8 lb

## 2024-11-03 DIAGNOSIS — I4819 Other persistent atrial fibrillation: Secondary | ICD-10-CM | POA: Diagnosis not present

## 2024-11-03 DIAGNOSIS — D6869 Other thrombophilia: Secondary | ICD-10-CM | POA: Diagnosis not present

## 2024-11-03 NOTE — Patient Instructions (Signed)
 Medication Instructions:  No medication changes today. *If you need a refill on your cardiac medications before your next appointment, please call your pharmacy*  Lab Work: No labwork ordered today. If you have labs (blood work) drawn today and your tests are completely normal, you will receive your results only by: MyChart Message (if you have MyChart) OR A paper copy in the mail If you have any lab test that is abnormal or we need to change your treatment, we will call you to review the results.  Testing/Procedures: No testing ordered today  Follow-Up: At [facility], you and your health needs are our priority.  As part of our continuing mission to provide you with exceptional heart care, our providers are all part of one team.  This team includes your primary Cardiologist (physician) and Advanced Practice Providers or APPs (Physician Assistants and Nurse Practitioners) who all work together to provide you with the care you need, when you need it.  Your next appointment:   6 month(s)  Provider:   You may see Will Cortland Ding, MD or one of the following Advanced Practice Providers on your designated Care Team:   Mertha Abrahams, [place] [address] [place], PA-C Suzann Riddle, NP Creighton Doffing, NP    We recommend signing up for the patient portal called "MyChart".  Sign up information is provided on this After Visit Summary.  MyChart is used to connect with patients for Virtual Visits (Telemedicine).  Patients are able to view lab/test results, encounter notes, upcoming appointments, etc.  Non-urgent messages can be sent to your provider as well.   To learn more about what you can do with MyChart, go to [email or web address].

## 2024-11-09 DIAGNOSIS — L570 Actinic keratosis: Secondary | ICD-10-CM | POA: Diagnosis not present

## 2025-05-11 ENCOUNTER — Ambulatory Visit (HOSPITAL_COMMUNITY): Admitting: Physician Assistant
